# Patient Record
Sex: Male | Born: 1965 | Race: Asian | Hispanic: No | Marital: Married | Smoking: Never smoker
Health system: Southern US, Community
[De-identification: ages and names within clinical notes are randomized; demographics above are authoritative.]

## PROBLEM LIST (undated history)

## (undated) DIAGNOSIS — T7840XA Allergy, unspecified, initial encounter: Secondary | ICD-10-CM

## (undated) HISTORY — DX: Allergy, unspecified, initial encounter: T78.40XA

---

## 1999-08-06 ENCOUNTER — Encounter: Payer: Self-pay | Admitting: Emergency Medicine

## 1999-08-06 ENCOUNTER — Emergency Department (HOSPITAL_COMMUNITY): Admission: EM | Admit: 1999-08-06 | Discharge: 1999-08-06 | Payer: Self-pay | Admitting: *Deleted

## 1999-08-07 ENCOUNTER — Emergency Department (HOSPITAL_COMMUNITY): Admission: EM | Admit: 1999-08-07 | Discharge: 1999-08-07 | Payer: Self-pay | Admitting: Emergency Medicine

## 1999-08-16 ENCOUNTER — Inpatient Hospital Stay (HOSPITAL_COMMUNITY): Admission: EM | Admit: 1999-08-16 | Discharge: 1999-08-23 | Payer: Self-pay | Admitting: Emergency Medicine

## 1999-08-16 ENCOUNTER — Encounter: Payer: Self-pay | Admitting: Emergency Medicine

## 1999-08-16 ENCOUNTER — Encounter (INDEPENDENT_AMBULATORY_CARE_PROVIDER_SITE_OTHER): Payer: Self-pay | Admitting: Specialist

## 1999-08-20 ENCOUNTER — Encounter: Payer: Self-pay | Admitting: Nephrology

## 1999-08-28 ENCOUNTER — Encounter: Payer: Self-pay | Admitting: Nephrology

## 1999-08-28 ENCOUNTER — Encounter: Admission: RE | Admit: 1999-08-28 | Discharge: 1999-08-28 | Payer: Self-pay | Admitting: Nephrology

## 2000-04-10 ENCOUNTER — Encounter: Admission: RE | Admit: 2000-04-10 | Discharge: 2000-04-10 | Payer: Self-pay | Admitting: Internal Medicine

## 2000-05-11 ENCOUNTER — Encounter: Admission: RE | Admit: 2000-05-11 | Discharge: 2000-05-11 | Payer: Self-pay | Admitting: Internal Medicine

## 2000-06-12 ENCOUNTER — Encounter: Admission: RE | Admit: 2000-06-12 | Discharge: 2000-06-12 | Payer: Self-pay | Admitting: Internal Medicine

## 2001-02-24 ENCOUNTER — Encounter: Admission: RE | Admit: 2001-02-24 | Discharge: 2001-02-24 | Payer: Self-pay

## 2001-02-24 ENCOUNTER — Inpatient Hospital Stay (HOSPITAL_COMMUNITY): Admission: EM | Admit: 2001-02-24 | Discharge: 2001-02-26 | Payer: Self-pay | Admitting: Emergency Medicine

## 2001-02-25 ENCOUNTER — Encounter: Payer: Self-pay | Admitting: Internal Medicine

## 2001-03-04 ENCOUNTER — Emergency Department (HOSPITAL_COMMUNITY): Admission: EM | Admit: 2001-03-04 | Discharge: 2001-03-04 | Payer: Self-pay

## 2001-03-10 ENCOUNTER — Encounter: Admission: RE | Admit: 2001-03-10 | Discharge: 2001-03-10 | Payer: Self-pay

## 2002-03-29 ENCOUNTER — Encounter: Payer: Self-pay | Admitting: Emergency Medicine

## 2002-03-29 ENCOUNTER — Emergency Department (HOSPITAL_COMMUNITY): Admission: EM | Admit: 2002-03-29 | Discharge: 2002-03-29 | Payer: Self-pay | Admitting: Emergency Medicine

## 2004-02-29 ENCOUNTER — Inpatient Hospital Stay (HOSPITAL_COMMUNITY): Admission: EM | Admit: 2004-02-29 | Discharge: 2004-03-03 | Payer: Self-pay | Admitting: Emergency Medicine

## 2004-02-29 ENCOUNTER — Ambulatory Visit: Payer: Self-pay | Admitting: Internal Medicine

## 2004-04-08 ENCOUNTER — Ambulatory Visit (HOSPITAL_COMMUNITY): Admission: RE | Admit: 2004-04-08 | Discharge: 2004-04-08 | Payer: Self-pay | Admitting: Nephrology

## 2004-04-08 ENCOUNTER — Encounter: Admission: RE | Admit: 2004-04-08 | Discharge: 2004-04-08 | Payer: Self-pay | Admitting: Nephrology

## 2004-04-11 ENCOUNTER — Emergency Department (HOSPITAL_COMMUNITY): Admission: EM | Admit: 2004-04-11 | Discharge: 2004-04-11 | Payer: Self-pay | Admitting: Emergency Medicine

## 2004-04-23 ENCOUNTER — Emergency Department (HOSPITAL_COMMUNITY): Admission: EM | Admit: 2004-04-23 | Discharge: 2004-04-23 | Payer: Self-pay | Admitting: Emergency Medicine

## 2008-09-17 ENCOUNTER — Emergency Department (HOSPITAL_COMMUNITY): Admission: EM | Admit: 2008-09-17 | Discharge: 2008-09-18 | Payer: Self-pay | Admitting: Emergency Medicine

## 2010-08-06 LAB — DIFFERENTIAL
Basophils Absolute: 0 10*3/uL (ref 0.0–0.1)
Basophils Relative: 0 % (ref 0–1)
Eosinophils Absolute: 0.1 10*3/uL (ref 0.0–0.7)
Eosinophils Relative: 1 % (ref 0–5)
Lymphocytes Relative: 8 % — ABNORMAL LOW (ref 12–46)
Lymphs Abs: 1.1 10*3/uL (ref 0.7–4.0)
Monocytes Absolute: 1.1 10*3/uL — ABNORMAL HIGH (ref 0.1–1.0)
Monocytes Relative: 7 % (ref 3–12)
Neutro Abs: 12.5 10*3/uL — ABNORMAL HIGH (ref 1.7–7.7)
Neutrophils Relative %: 85 % — ABNORMAL HIGH (ref 43–77)

## 2010-08-06 LAB — LIPASE, BLOOD: Lipase: 18 U/L (ref 11–59)

## 2010-08-06 LAB — COMPREHENSIVE METABOLIC PANEL
ALT: 20 U/L (ref 0–53)
AST: 27 U/L (ref 0–37)
Albumin: 4 g/dL (ref 3.5–5.2)
Alkaline Phosphatase: 43 U/L (ref 39–117)
BUN: 13 mg/dL (ref 6–23)
CO2: 24 mEq/L (ref 19–32)
Calcium: 9.4 mg/dL (ref 8.4–10.5)
Chloride: 106 mEq/L (ref 96–112)
Creatinine, Ser: 0.69 mg/dL (ref 0.4–1.5)
GFR calc Af Amer: >60 mL/min (ref >60)
GFR calc non Af Amer: >60 mL/min (ref >60)
Glucose, Bld: 120 mg/dL — ABNORMAL HIGH (ref 70–99)
Potassium: 3.6 mEq/L (ref 3.5–5.1)
Sodium: 138 mEq/L (ref 135–145)
Total Bilirubin: 0.9 mg/dL (ref 0.3–1.2)
Total Protein: 7.6 g/dL (ref 6.0–8.3)

## 2010-08-06 LAB — CBC
HCT: 44.4 % (ref 39.0–52.0)
Hemoglobin: 15.2 g/dL (ref 13.0–17.0)
MCHC: 34.2 g/dL (ref 30.0–36.0)
MCV: 90 fL (ref 78.0–100.0)
Platelets: 180 K/uL (ref 150–400)
RBC: 4.94 MIL/uL (ref 4.22–5.81)
RDW: 13 % (ref 11.5–15.5)
WBC: 14.8 K/uL — ABNORMAL HIGH (ref 4.0–10.5)

## 2010-08-06 LAB — URINALYSIS, ROUTINE W REFLEX MICROSCOPIC
Bilirubin Urine: NEGATIVE
Glucose, UA: NEGATIVE mg/dL
Hgb urine dipstick: NEGATIVE
Ketones, ur: 15 mg/dL — AB
Nitrite: NEGATIVE
Protein, ur: NEGATIVE mg/dL
Specific Gravity, Urine: 1.02 (ref 1.005–1.030)
Urobilinogen, UA: 0.2 mg/dL (ref 0.0–1.0)
pH: 5 (ref 5.0–8.0)

## 2010-09-13 NOTE — Discharge Summary (Signed)
NAME:  Antonio Zuniga, Antonio Zuniga                   ACCOUNT NO.:  [id number]   MEDICAL RECORD NO.:  [id number]          PATIENT TYPE:  INP   LOCATION:  5019                         FACILITY:  [facility]   PHYSICIAN:  Melissa L. Ladona Ridgel, MD  DATE OF BIRTH:  1966-02-29   DATE OF ADMISSION:  02/28/2004  DATE OF DISCHARGE:  03/03/2004                                 DISCHARGE SUMMARY   DISCHARGE DIAGNOSES:  1.  Abdominal pain.  The etiology of the patient's abdominal pain is      unclear.  His cultures have remained negative for infectious process.      His ultrasound of the gallbladder appears to be noncontributory for      acute cholecystitis.  It is likely that the edema related to his minimal      change kidney disease may be contributing to some malabsorption and/or      the patient may have gastroenteritis.  The patient was seen during the      hospital stay by the [facility] who felt that no further work-up was      necessary as long as the patient was afebrile and did not have any      abdominal discomfort and was able to eat.  2.  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      Patient has a history of being on chronic steroids which has resulted in      bone changes.  He did not follow up appropriately with the outpatient      setting likely secondary to a miscommunication with his primary care      physician.  I have explained to him that he needs to be in close contact      with Dr. Marland Mcalpine office and maintain all of his appointments at the      office and bring a translator since it seems he is not able to      understand the [place] language well enough to take care of himself.  At      this time after consulting with the nephrologist the decision has been      made not to start him on steroids but that he should follow-up in the      outpatient office for potential more intensive therapy that would be     appropriate for his proteinuria and minimal change disease.  3.  Avascular necrosis of the bilateral hips.  At this time, relatively      asymptomatic.  I have spoken with Dr. Sherlean Foot who is the unassigned on-      call orthopedist at this time in the hospital.  Dr. Sherlean Foot stated that he      would be willing to see [name] in the outpatient setting.  I have      therefore provided a phone number for him to follow up with the      orthopedist.  Eventually, the patient will likely need bilateral hip  replacements.  At this time he does not appear to be symptomatic from      his disease and we will therefore refer him for outpatient follow-up.  4.  Hypothyroidism.  The patient appears to have clinical hypothyroidism      which may be contributing to his lower extremity edema.  I have      therefore started him on low dose Synthroid and explained to him the      significance of his disease.  We will attempt to obtain an outpatient      endocrinology follow-up.  5.  Hypercholesterolemia.  Patient's cholesterol is unacceptably high.  I      have therefore started him on Zocor.  I am going to request a follow-up      with his primary care practitioner who we have yet to establish.   DISCHARGE MEDICATIONS:  1.  Zocor 40 mg q.h.s.  2.  Synthroid 25 mcg p.o. daily.  3.  I have reviewed the patient's case at length using a translator during      the full course of the hospital stay and at discharge I have reviewed      his pink discharge sheet through the interpreter.  Patient voiced      understanding of what I have written in English on his sheet and has      made [place] notations so he will recall what this sheet says.   HISTORY OF PRESENT ILLNESS:  Patient is a 45 year old [place] male who  presented to the emergency room with diarrhea, abdominal cramping for  several days.  The patient also states he does not understand why his lower  extremity is swelling and his abdomen is  distended.  After investigating the  patient's past medical history we discovered that he has been previously  diagnosed with minimal change disease under the care of the [facility].  He has had biopsy and has been treated in the past with  prednisone.  The patient returns to the hospital with exacerbation of his  minimal change disease.  With regard to his abdominal pain, this appears  unrelated to his minimal change disease and a full GI work-up was obtained.  The patient was placed on empiric antibiotics to cover his gallbladder and  [facility] was consulted to evaluate him.  After reviewing his ultrasounds  and HIDA it appears he has no acute gallbladder disease at this time and he  had no evidence for bacterial infection on his cultures.  It is felt that  perhaps he has gastroenteritis that may be viral in origin.  His antibiotics  were therefore discontinued.  He remained afebrile with no further symptoms  of diarrhea or abdominal pain.  He was able to eat a full diet.   With regard to his renal disease he obviously needs intensive follow-up as  an outpatient.  I have discussed this disease with him through an  interpreter.  He expressed understanding of the extent and severity of his  disease and what to expect from his disease.  We have agreed that he will  follow up at the [facility] as soon as he is discharged.  He  states that he has an appointment on Monday, November 1 which is timely as  it is the day after discharge.  I will attempt to assist the patient with  obtaining a primary care physician and will speak with Dr. Hyman Hopes about also  assisting him with  follow-up for endocrinology and primary care.   PHYSICAL EXAMINATION:  VITAL SIGNS:  On the day of discharge the patient's  vital signs are stable.  He is afebrile.  GENERAL:  Remains in no acute distress. HEENT:  Pupils are equal, round, and reactive to light.  Extraocular muscles  are  intact.  Mucous membranes are moist.  NECK:  Supple.  There is no JVD.  No lymph nodes.  CHEST:  Clear to auscultation.  There is no rhonchi, rales, or wheezes.  CARDIOVASCULAR:  Regular rate and rhythm.  Positive S1, S2.  No S3, S4.  No  murmurs, rubs, or gallops.  ABDOMEN:  Soft, nontender, nondistended with no ascites.  EXTREMITIES:  No edema.  There is 2+ pulses.  SKIN:  Dry.  NEUROLOGIC:  The patient is nonfocal.   LABORATORIES:  Stool cultures which were no salmonella, shigella,  Campylobacter, or Yersinia.  His C. difficile was negative.  His Giardia and  a Cryptosporidium are negative.  His thyroid studies reveal a TSH of 7.9, T3  of 89.3, and a T4 of 3.6 which is low.  His total 24-hour creatinine  revealed a value of 1932 which is excessively elevated and his total 24-hour  protein is excessively elevated at 9765.  His blood cultures have remained  negative during the course of his hospital stay.  His total cholesterol is  560 with a triglyceride of 132 and an LDL of 480.  His HDL is 54.  Hepatic  functions are within normal limits.  There are no elevations in the AST or  ALT.   At this time we feel that the patient is stable for discharge to home with  follow-up to nephrology and the establishment of primary care and  endocrinology follow-up.  I have also given him the phone number for Dr.  Sherlean Foot to follow up for his avascular necrosis of his hips.      Meli   MLT/MEDQ  D:  03/05/2004  T:  03/05/2004  Job:  [id number]   cc:   Garnetta Buddy, M.D.  [address]  [place]  [place] [address]  Fax: [phone]   Mila Homer. Sherlean Foot, M.D.  [address] [place]  [place] [address]  Fax: [phone]

## 2010-09-13 NOTE — Discharge Summary (Signed)
[place]. [facility]  Patient:    Antonio Zuniga, Antonio Zuniga Visit Number: [id number] MRN: [id number]          Service Type: MED Location: [phone] Attending Physician:  Madaline Guthrie Dictated by:   Jennette Kettle, M.D. Admit Date:  02/24/2001 Discharge Date: 02/26/2001                             Discharge Summary  DISCHARGE DIAGNOSES: 1. Recurrent nephrotic syndrome (current hospitalization). 2. Abdominal pain (likely secondary to #1, current hospitalization resolved). 3. Positive PPD, status post nine months of isoniazid treatment.  DISCHARGE MEDICATIONS: 1. Prednisone 70 mg p.o. q.d. 2. Lasix 40 mg p.o. q.d. 3. Protonix 40 mg p.o. q.d.  FOLLOWUP: 1. The patient has a follow-up appointment with Dr. Manson Passey in the internal    medicine outpatient clinic on March 10, 2001, at 3:15 p.m. 2. The patient will follow up with [facility], Dr. [name],    telephone number [phone].  [facility] was notified, and    they will call the patient at home with the appointment time.  PROCEDURES AND DIAGNOSTIC STUDIES:  The patient had abdominal and chest x-ray which revealed a left small pleural effusion with a negative abdominal x-ray.  CONSULTATIONS:  [employer].  HISTORY OF PRESENT ILLNESS:  The patient is a 45 year old [place] male with a past medical history of nephrotic syndrome (admitted in April 2001) who presented on 02/24/01, with the chief complaint of abdominal pain x 1 day. Positive vomiting.  Abdominal pain was diffuse, seeming mostly in the epigastric, umbilical region.  The patient denied any diarrhea, constipation, hematemesis, melena, hematochezia, dysuria, or hematuria.  The patient, at the time of admission, stated this feeling was similar to his last episode when he was admitted for his nephrotic syndrome.  The patient also complained of bilateral lower extremity swelling.  ADMISSION LABORATORY  DATA:  The patient initially had electrolytes that revealed sodium of 139, potassium 3.9, chloride 104, bicarbonate 32, BUN 26, creatinine 1.0, calcium 7.7, glucose of 136.  The patients total protein was 5.1, albumin was decreased at 1.4.  The patient initially had an UA which revealed greater than 300 protein with many squamous epithelial/granualar casts, 3 to 6 white blood cells, 7 to 10 red blood cells, few bacteria.  The patients initial liver function tests revealed an AST of 23, ALT 14, alkaline phosphatase 47, total bilirubin 1.5.  The patients initial CBC revealed a white count of 9.7, hemoglobin 15.7, platelets 266 with no left shift.  HOSPITAL COURSE:  #1 - ABDOMINAL PAIN:  The likely etiology for the patients abdominal pain was likely related to the nephrotic syndrome, given his previous similar episode. The patient did have a normal lipase level at 21 upon admission.  The patient had a negative abdominal x-ray.  The patients abdominal pain subsided after a 24 hour period.  The patient was started on prednisone, Lasix, and Protonix for his likely nephrotic syndrome.  The patient had a sedimentation rate which was slightly elevated at 79.  The patient has a pending CENCA and PENCA at the time of discharge.  Other possible etiologies, which are not as likely, included a vasculitis versus gastritis.  #2 - NEPHROTIC SYNDROME:  The patient was admitted to the hospital, renal was consulted.  The patient was initially started on prednisone 70 mg IV q.d., as well as Lasix 80 mg IV q.12h.  A  24 hour urine was collected.  The patient had an UA with results above.  [facility] recommendations were to follow up with Dr. [name] as an outpatient, and continue the steroids until notified by [facility].  The patient is being discharged home on p.o. Lasix 40 mg q.d.  The patients lower extremity swelling has subsided. The patient did have a decreased albumin, as  well as total protein.  FOLLOWUP:  The patient has a follow-up appointment with Dr. Manson Passey, who is his primary care physician at the internal medicine outpatient clinic on March 10, 2001, at 3:15.  At that time, consider checking a basic metabolic panel for potassium levels, given that the patient is being discharged home on Lasix 40 mg p.o. q.d.  The patient at discharge had a potassium level of 4.2.  The patient does have a pending CENCA and PENCA, as well as a 24 hour urine protein at the time of discharge.  Further followup with [facility] will be telephoned to the patient upon discharge. Dictated by:   Jennette Kettle, M.D. Attending Physician:  Madaline Guthrie DD:  02/26/01 TD:  03/01/01 Job: [id number]

## 2010-09-13 NOTE — Discharge Summary (Signed)
[place]. [facility]  Patient:    Antonio Zuniga, Antonio Zuniga                        MRN: [id number] Adm. Date:  [id number] Disc. Date: [id number] Attending:  Maree Krabbe Dictator:   Leory Plowman, M.D.                           Discharge Summary  ADDENDUM #2 [phone])  HOSPITAL COURSE:  In light of the patients positive PPD and possible need for immunosuppressive therapy, the case was discussed with Dr. Fransisco Hertz. Dr. Maurice March recommended one year of isoniazid for this patient as the CDC recommendations are now for all patients with positive PPDs to receive isoniazid therapy and since this patient may very well need immunosuppressive treatment for his nephrotic syndrome. DD:  08/23/99 TD:  08/24/99 Job: [id number]

## 2010-09-13 NOTE — Discharge Summary (Signed)
[place]. [facility]  Patient:    ANOOP, HEMMER                        MRN: [id number] Adm. Date:  [id number] Disc. Date: [id number] Attending:  Maree Krabbe Dictator:   Leory Plowman, M.D.                           Discharge Summary  ADDENDUM [phone])  DISCHARGE DIAGNOSES:  Add:  Positive PPD with chest x-ray negative for evidence of tuberculosis.  No prior PPD placement reported.  DISCHARGE MEDICATIONS:  Delete:  Prednisone.  Patient is only discharged on Lasix.  HOSPITAL COURSE:  Add:  Note that patients PPD was read was read as positive by the nursing staff who is skilled in making this assessment.  For this reason, he is sent home on Lasix alone and not prednisone as the benefits are uncertain and the risks are known. DD:  08/23/99 TD:  08/24/99 Job: [id number]

## 2010-09-13 NOTE — H&P (Signed)
NAME:  Antonio Zuniga, Antonio Zuniga                   ACCOUNT NO.:  [id number]   MEDICAL RECORD NO.:  [id number]          PATIENT TYPE:  INP   LOCATION:                               FACILITY:  [facility]   PHYSICIAN:  [name], M.D.DATE OF BIRTH:  [birth year]   DATE OF ADMISSION:  02/29/2004  DATE OF DISCHARGE:                                HISTORY & PHYSICAL   PRIMARY CARE PHYSICIAN:  Unassigned   CHIEF COMPLAINT:  Abdominal pain with nausea and vomiting.   HISTORY OF PRESENT ILLNESS:  The patient is a 45 year old [place] male,  very limited English, who presents with above complaints x1 day.  The  patient had his friend at bed side initially interpreting but he is now gone  home and history is obtained from patient and chart review.  She describes  the abdominal pain as upper abdominal in location, moderate intensity,  aching, and no alleviating or exacerbating factors noted.  He had vomiting  x1 day and from ER records he stated that he also had diarrhea x1 day.  He  admitted to subjective fevers and chills, dysuria, back pain, headaches, and  sore throat.  The patient also admitted to lower extremity edema x4 days.  She denied shortness of breath and no PND.   The patient was evaluated in the emergency room and a CT scan of the abdomen  and pelvis obtained and was found to be abnormal and also his urinalysis  revealed proteinuria and the patient is admitted to the [facility] hospitalists  service for further evaluation and management.   PAST MEDICAL HISTORY:  None.   MEDICATIONS:  None.   SOCIAL HISTORY:  Denies tobacco.  Occasional alcohol.  Denies illicit drug  use.   FAMILY HISTORY:  Noncontributory.  Denies family history of diabetes or  hypertension.   REVIEW OF SYSTEMS:  As per HPI.  Other comprehensive review of systems  negative.   PHYSICAL EXAMINATION:  GENERAL:  The patient is a middle-aged [place]  male in no acute distress.  VITAL SIGNS:  Temperature 98, blood  pressure 129/74, pulse 64, respiratory  rate 18, O2 saturation 96%.  HEENT:  PERRL, EOMI.  Sclerae anicteric.  Moist mucous membranes.  No oral  exudates.  NECK:  Supple.  No adenopathy.  No thyromegaly.  LUNGS:  Clear to auscultation bilaterally.  No crackles or wheezes.  CARDIOVASCULAR:  Normal S1, S2.  Regular rate and rhythm.  ABDOMEN:  Soft, normoactive bowel sounds.  Epigastric and supraumbilical  tenderness present.  No rebound tenderness.  No masses palpable.  Nondistended.  EXTREMITIES:  +1 edema present up to knee.  NEUROLOGIC:  Alert and oriented x3.  Cranial nerves II-XII grossly intact.  Nonfocal examination.   LABORATORY DATA:  The CT scan of the abdomen and pelvis:  Small bilateral  pleural effusions.  Gallbladder wall looks thickened.  Cannot exclude  cholecystitis.  Small amount of pelvic ascites and mild mesenteric edema.  Nonvisualization of the appendix.  There is avascular necrosis of both  femoral heads and spurring in the left SI joint.  The  urinalysis is clear in  appearance with specific gravity greater than 1.040, pH 7.0, leukocyte  esterase negative, nitrite negative, protein greater than 300, urine wbc's 0-  2, urine rbc's 0-2, bacteria rare hyalin casts.  Sodium 134, potassium 4.4,  chloride 105, BUN 18, glucose 98.  pH 7.37, pCO2 52.4, bicarbonate 30.3.  Hematocrit 50, hemoglobin 17, white cell count 12.3, platelet count 254,  neutrophil count 86%.  His lipase is 24.   ASSESSMENT/PLAN:  1.  Abdominal pain/?cholecystitis, acalculous.  Check LFTs and HIDA scan as      well as hepatitis panel and blood cultures.  Patient's lipase noted to      be within normal limits.  Will start empiric antibiotics with Unasyn to      cover for possible cholecystitis, supportive care.  Will follow and      consider a surgical consultation pending above tests.  2.  Proteinuria.  A urinalysis was protein greater than 300.  Also, specific      gravity noted to be greater than  1.040.  The patient was scheduled for      an outpatient appointment with Dr. Hyman Hopes.  BUN and creatinine noted to be      within normal limits.  Will check a serum albumin and 24-hour urine for      creatinine and protein.  Will follow and consider inpatient      nephrology/Dr. Hyman Hopes consult pending above tests.  3.  Bilateral avascular necrosis of femoral heads.  Patient denies hip/lower      extremity pain, falling.  Consider orthopedic consult in a.m.  4.  Peripheral edema.  Will check albumin as above and evaluation to rule      out a nephrotic syndrome/kidney disease per studies above.  Follow and      consider 2-D echocardiogram for further evaluation.  5.  Small pleural effusion/small pelvic ascites/mild mesenteric edema.  Per      CT scan.  Will check albumin as above.  Follow and consider further      evaluation as appropriate.        ___________________________________________  [name], M.D.    ACV/MEDQ  D:  02/29/2004  T:  02/29/2004  Job:  [id number]   cc:   Dala Dock

## 2010-09-13 NOTE — Consult Note (Signed)
NAMESABASTIEN, Antonio Zuniga                   ACCOUNT NO.:  [id number]   MEDICAL RECORD NO.:  [id number]          PATIENT TYPE:  INP   LOCATION:  5019                         FACILITY:  [facility]   PHYSICIAN:  Antonio Zuniga, M.D.DATE OF BIRTH:  12-17-1965   DATE OF CONSULTATION:  03/01/2004  DATE OF DISCHARGE:                                   CONSULTATION   CHIEF COMPLAINT:  Abdominal pain with nausea and vomiting.   HISTORY OF PRESENT ILLNESS:  This is a 45 year old [place] male with very  limited English who presents to [facility] with nausea, vomiting,  abdominal distention, and generalized swelling/edema for one week's  duration.  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  It is difficult to assess how long the patient was on  prednisone at that time.  Records indicate that he was to be on steroids for  six to nine months.  I have having considerable difficulty assessing his  compliance secondary to language difficulty.  Otherwise patient denies any  chest pain, shortness of breath, dysuria, or hematuria.  We are asked to see  patient for massive proteinuria.   PAST MEDICAL HISTORY:  1.  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  2.  Positive PPD.  Records indicate the patient was instructed to complete a      2-month course of INH.  Again, it is unclear if he was complaint.   MEDICATIONS:  None on hospital admission.  He is currently on:  1.  Unasyn 3 g IV q.6h.  2.  Protonix 40 mg daily.  3.  Zocor 40 mg daily.  4.  Phenergan 25 mg q.6h. p.r.n.   ALLERGIES:  No known drug allergies.   SOCIAL HISTORY:  Denies any history of alcohol, tobacco, or drug abuse.   FAMILY HISTORY:  Noncontributory.  He denies any history of diabetes or  hypertension.   REVIEW OF SYSTEMS:  As per HPI.  Other comprehensive Review of Systems was  essentially negative.   PHYSICAL EXAMINATION:  GENERAL: This is a well-built, middle-aged  [place]  male.  He is in no apparent acute distress.  He is lying comfortably in bed.  VITAL SIGNS:  Temperature 97.2 degrees F, pulse 69 and regular, respirations  13, blood pressure 106/72 mmHg.  Saturation 97% on room air.  HEENT:  Pupils equal, round, and reactive to light.  Extraocular muscles are  intact.  Sclerae is anicteric.  Conjunctivae moist.  NECK:  Supple.  There is no JVD, thyromegaly, tracheal deviation, or carotid  bruits.  CARDIOVASCULAR:  S1, S2 are regular in rate and rhythm.  There are no  murmurs, rubs, or gallops.  There is no S3 or S4.  LUNGS:  Bibasilar crackles and decreased bibasilar breath sounds.  Otherwise  clear to auscultation.  ABDOMEN:  Soft, nontender, nondistended.  Positive bowel sounds.  No  organomegaly.  EXTREMITIES:  1 to 2+ pitting edema to the level of the knee.  Dorsalis  pedis bilaterally is faint but palpable.  Bilateral radial pulses are  appropriate at  2/2.  NEUROLOGIC:  He is alert, awake.  He is oriented x 3.  Cranial nerves II-XII  grossly intact.  There are no focal deficits.   LABORATORY AND X-RAY DATA:  A 24-hour urine protein 9765 mg.  Urine  creatinine is 1932 g per 24 hours.  Calculated creatinine clearance is 95 ml  per minute.  CBC shows WBC 7.6, hemoglobin 14.5, platelets 237, C. difficile  toxin negative.  Stool for white blood cells in negative.  Sodium 133,  potassium 3.5, chloride 102, bicarb 28, BUN 13, creatinine 1.0, glucose 111,  total protein 0.6, alkaline phosphatase 86, AST 24, ALT 12, total protein  3.9, albumin less than 1.0, calcium 7.0.  Corrected calcium is 9.4.  Lipase  24.  Cholesterol panel shows cholesterol 560, triglycerides 132, HDL 54, LDL  40.  Blood cultures:  No growth.  Fecal occult blood is negative.  Urinalysis is clear with a specific gravity greater than 1.04, pH 7.0, and  greater than 300 mg/dl protein.   CT findings show free pelvic fluid as well as bilateral femoral head  avascular  necrosis and small bilateral pleural effusions.   Biopsy in April 2001 shows minimal-change versus FSG.   IMPRESSION AND PLAN:  1.  Massive proteinuria consistent with minimal-change disease.  The patient      needs high-dose steroids, but in light of his CT findings of avascular      necrosis, I am somewhat hesitant to start him on prednisone.  Ideally,      the patient needs 1 mg/kg for 8 to 16 weeks or 1 week after remission      has been induced.  Remission would be the goal of treatment which is to      reduce proteinuria or essentially eliminate proteinuria.  Following      remission, the patient would be on 1 mg/kg on alternate days to reduce      the adverse effects of glucocorticoids.  Once the proteinuria      disappears, the patient can be tapered off prednisone.  Again, the      patient was scheduled to follow up with [facility] following his      biopsy, but he failed to do so.  He was lost to followup for the last      two years and had been taking prednisone on his own accord.  For this      reason, I am somewhat hesitant to start him on prednisone or cytotoxic      agents.  Other agents which are less toxic, which would also decrease      his proteinuria, would include ACE inhibitors, ARBs, or verapamil or      trial of CellCept.  Again, these would be more appropriate for this      patient considering his compliance.  The problem with ACE inhibitors and      ARBs would be limited with his blood pressure.  He does run systolic      blood pressure in the low 100s.  2.  Hypercholesterolemia. The patient has an LDL of 480.  He is at great      risk for coronary artery disease/stroke with an LDL of this level.  He      needs aggressive management.  This can be done with Lipitor or Vitorin.      Also his hypercholesterolemia will be improved once his proteinuria is      at rest.  The patient  has an appointment with [facility], Dr. [name],      on December 17 at 2:30 in  the afternoon.       IO/MEDQ  D:  03/01/2004  T:  03/02/2004  Job:  [id number]   cc:   [name], M.D.

## 2010-09-13 NOTE — Discharge Summary (Signed)
[place]  Patient:    Antonio Zuniga, Antonio Zuniga                        MRN: [id number] Adm. Date:  [id number] Disc. Date: [id number] Attending:  Maree Krabbe Dictator:   Leory Plowman, M.D.                           Discharge Summary  DISCHARGE DIAGNOSES: 1. Nephrotic syndrome with 6.7 g 24-hour urinary protein, diagnosis pending. 2. Hyperlipidemia, LDL 530, triglycerides 156. 3. Hypoalbuminemia with albumin 0.8.  DISCHARGE MEDICATIONS: 1. Prednisone 70 mg p.o. q.d. 2. Lasix 40 mg p.o. q.d.  FOLLOW-UP:  Patient is scheduled to follow up with Dr. Camille Bal on May 1 at 4:15.  At that time, a BMET may be appropriate as the patient has been on Lasix.  The results of his renal biopsy which was sent to Dr. Ricke Hey at the [facility] may be available at that time.  HISTORY OF PRESENT ILLNESS:  Antonio Zuniga is a 45 year old [place] gentleman with no past medical history who presented to [facility] emergency room on August 06, 1999, complaining of a poor appetite and lower extremity edema.  He denied any other specific complaints except for a vague sense of weakness and some nausea.  ALLERGIES:  None.  PAST MEDICAL HISTORY:  None.  PAST SURGICAL HISTORY:  None.  PRIOR MEDICATIONS:  None.  SOCIAL HISTORY:  Patient is a [place] gentleman who has been living in the [place] for three years.  He is married with a 30-year-old little girl. He is a nonsmoker, nondrinker, and a nondrug user.  He has no unusual exposure to animals, TB exposure, recent travel, or history of homosexuality.  REVIEW OF SYSTEMS:  Negative except as described above.  PHYSICAL EXAMINATION:  Significant for a patient who had decreased breath sounds at the bases and 4+ pitting edema of the feet extending up to the thighs.  LABORATORY DATA:  Significant for a total protein of 3.5 and albumin of 0.8, a BUN of 14, with a creatinine of 0.8, and a  potassium of 4.4.  His bicarb was 30.  His hemoglobin was 15.4.  Chest x-ray revealed small bilateral pleural effusions.  IMPRESSION:  A 45 year old previously healthy gentleman with new onset nephrotic syndrome.  HOSPITAL COURSE:  Patient was admitted for treatment of edema and serologic evaluation of his nephrotic syndrome.  He was treated with Lasix initially IV and then p.o. and diuresed quite readily.  Serologic evaluation included an ANA, hepatitis C, hepatitis B, complement studies, HIV test, ASO titer, all of which were normal.  Of note is that a 24-hour urine protein revealed 6.69 g of protein, and his lipid panel showed a cholesterol of 613 with triglycerides 156, HDL 52, and LDL 530.  A renal biopsy was performed by Dr. Marina Gravel on August 20, 1999.  Four passes were made at the kidney with only one core sample obtained.  The patient tolerated the procedure well with stable hemoglobin and no evidence of bleeding.  The results of the biopsy were pending at the time of his discharge.  Throughout the patients hospital stay, the family was kept apprised of his course via daily meetings with English-speaking friends.  On the date of discharge, it was decided to discharge the patient on oral steroids and low-dose Lasix therapy with close follow-up.  Note that  a PPD was performed which was negative and a chest x-ray from admission did not show evidence of tuberculosis.  DISCHARGE LABORATORY DATA:  Sodium 135, potassium 3.9, chloride 101, bicarb 31, BUN 14, creatinine 0.7, and glucose 102.  Calcium was 7.5.  Hemoglobin 13.9, white blood cell count 8.1, platelets 269.  DISCHARGE DISPOSITION:  Patient is discharged to home.  CONDITION ON DISCHARGE:  Stable. DD:  08/23/99 TD:  08/23/99 Job: [id number]

## 2010-09-13 NOTE — Consult Note (Signed)
[place]  Patient:    Antonio Zuniga, Antonio Zuniga Visit Number: [id number] MRN: [id number]          Service Type: MED Location: 1800 [id number] Attending Physician:  Sandi Raveling Dictated by:   Cecille Aver, M.D. Admit Date:  02/24/2001                            Consultation Report  CONSULTING PHYSICIAN:  Elliott L. Effie Shy, M.D.  REASON FOR CONSULTATION:  Nephrotic syndrome.  HISTORY OF PRESENT ILLNESS:  Antonio Zuniga is a 45 year old [place] male who initially came to medical attention in April of 2001 when he presented with nephrotic syndrome. He had a full serologic workup that was within normal limits. 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 He was treated with steroids for this. According to his primary care physician, the patient responded fairly quickly and went into remission of his nephrotic syndrome and was eventually weaned off of the steroids. The official records regarding this treatment are not available to me at this time to verify this. He had been in his usual state of health until he presented to the outpatient clinic today with a three-day history of worsening lower extremity edema, nausea, vomiting, malaise, plus or minus fevers and chills. He reported that these symptoms "the same symptoms as last time." He is admitted for further evaluation mostly secondary to the language barrier and his possible inability to understand the situation.  PAST MEDICAL HISTORY:  History of nephrotic syndrome as above.  ALLERGIES:  No known drug allergies.  CURRENT MEDICATIONS:  P.r.n. Lasix.  SOCIAL HISTORY:  He has been living in the [place] for four years. He is non-English speaking. He is married and has one child. There is no significant history of tobacco, alcohol, or any other drug use.  FAMILY HISTORY:   Noncontributory.  REVIEW OF SYSTEMS:  Difficult to obtain secondary to a language barrier. It is positive for GI upset, nausea, vomiting, and abdominal pain, and also lower extremity edema. He denies any other change in his urine output. He does have a decreased appetite.  PHYSICAL EXAMINATION:  GENERAL:  He is a somnolent but arousable, young male. He is in no acute distress. It is difficult to communicate with him secondary to the language barrier.  VITAL SIGNS:  Blood pressure is 140/92, heart rate of 75, respirations of 20, temperature is 98.7, oxygen saturation is 96% on room air.  HEENT:  Pupils are equal, round, and reactive to light. Extraocular motions are intact. Mucous membranes are moist and without lesions.  NECK:  There is no jugular venous distention or lymphadenopathy.  LUNGS:  Clear to auscultation bilaterally without wheezes.  CARDIOVASCULAR:  Regular rate and rhythm without murmur, gallop, or rub.  ABDOMEN:  Positive bowel sounds. It is diffusely, mildly, tender; nondistended. There is no hepatosplenomegaly.  EXTREMITIES:  There is 1 to 2+ edema to lower extremities bilaterally to the level of his thighs.  NEUROLOGICAL:  The patient is somnolent but arousable and alert. Follows commands.  LABORATORY DATA:  White blood count of 9.7, hemoglobin of 15.7, platelet count of 266,000. Sodium of 139, potassium 3.9, chloride of 104, bicarb of 32, BUN and creatinine of 26 and 1, with glucose of 136; his albumin is 1.4. Urinalysis is performed and is remarkable for 4+ protein and moderate blood.  ASSESSMENT:  A 45 year old [place] male with recurrent nephrotic syndrome.  1. Renal:  Recurrent nephrotic syndrome. His course is most consistent with    minimal change disease. Since patient reportedly has a quick and good    response to steroids last time, I would begin him back on prednisone at 1    mg/kg per day. This will need to be followed as an outpatient. 2.  Nausea, vomiting, and gastrointestinal distress:  This is not exactly    consistent with symptoms of nephrotic syndrome. I have advised for them to    give him PPI with the steroid dose to eliminate any GI distress. Otherwise,    symptomatic treatment for now will be given. I agree with acute abdominal    series to rule out any other intra-abdominal pathology.  Thank you for the consultation. We will follow with you. Dictated by:   Cecille Aver, M.D. Attending Physician:  Sandi Raveling DD:  02/24/01 TD:  02/25/01 Job: [id number]

## 2011-09-12 ENCOUNTER — Ambulatory Visit (INDEPENDENT_AMBULATORY_CARE_PROVIDER_SITE_OTHER): Payer: BC Managed Care – PPO | Admitting: Family Medicine

## 2011-09-12 DIAGNOSIS — J301 Allergic rhinitis due to pollen: Secondary | ICD-10-CM

## 2011-09-12 DIAGNOSIS — H1010 Acute atopic conjunctivitis, unspecified eye: Secondary | ICD-10-CM

## 2011-09-12 MED ORDER — OLOPATADINE HCL 0.1 % OP SOLN: 1.0000 [drp] | Freq: Two times a day (BID) | OPHTHALMIC | Status: AC

## 2011-09-12 MED ORDER — FLUTICASONE PROPIONATE 50 MCG/ACT NA SUSP: 2.0000 | Freq: Every day | NASAL | Status: DC

## 2011-09-12 NOTE — Progress Notes (Signed)
  Patient Name: Antonio Zuniga Date of Birth: 1965/12/20 Medical Record Number: [id number] Gender: male Date of Encounter: 09/12/2011  History of Present Illness:  [name] is a 46 y.o. very pleasant male patient who presents with the following:  Here today with complaint of stuffy/ runny nose and sneezing for about 2 months.  Eyes are also itchy and irritated. A little bit of cough.   No fever, nausea or vomiting.   Otherwise he is generally healthy and does tend to have these symptoms every year.   He has tried some mucinex but it did not help much  There is no problem list on file for this patient.  No past medical history on file. No past surgical history on file. History  Substance Use Topics  . Smoking status: Never Smoker   . Smokeless tobacco: Not on file  . Alcohol Use: Not on file   No family history on file. No Known Allergies  Medication list has been reviewed and updated.  Review of Systems: As per HPI- otherwise negative.   Physical Examination: Filed Vitals:   09/12/11 1649  BP: 128/87  Pulse: 61  Temp: 97.7 F (36.5 C)  Resp: 18  Height: 5\' 9"  (1.753 m)  Weight: 175 lb (79.379 kg)    Body mass index is 25.84 kg/(m^2).  GEN: WDWN, NAD, Non-toxic, A & O x 3 HEENT: Atraumatic, Normocephalic. Neck supple. No masses, No LAD.  Nasal cavity congested, eyes irritated, PEERL, oropharynx wnl.  TM wnl bilaterally Ears and Nose: No external deformity. CV: RRR, No M/G/R. No JVD. No thrill. No extra heart sounds. PULM: CTA B, no wheezes, crackles, rhonchi. No retractions. No resp. distress. No accessory muscle use. EXTR: No c/c/e NEURO Normal gait.  PSYCH: Normally interactive. Conversant. Not depressed or anxious appearing.  Calm demeanor.    Assessment and Plan: 1. Allergic rhinitis  fluticasone (FLONASE) 50 MCG/ACT nasal spray  2. Allergic conjunctivitis  olopatadine (PATANOL) 0.1 % ophthalmic solution   Yearly AR/ AC.  Treat as above- also may try  OTC claritin or zyrtec.  Patient (or parent if minor) instructed to return to clinic or call if not better in 4-5 day(s).

## 2012-09-27 ENCOUNTER — Other Ambulatory Visit: Payer: Self-pay | Admitting: Family Medicine

## 2013-06-04 ENCOUNTER — Ambulatory Visit: Payer: BC Managed Care – PPO | Admitting: Family Medicine

## 2013-06-04 VITALS — BP 128/72 | HR 71 | Temp 97.7°F | Resp 16

## 2013-06-04 DIAGNOSIS — J302 Other seasonal allergic rhinitis: Secondary | ICD-10-CM

## 2013-06-04 DIAGNOSIS — J309 Allergic rhinitis, unspecified: Secondary | ICD-10-CM

## 2013-06-04 MED ORDER — FLUTICASONE PROPIONATE 50 MCG/ACT NA SUSP: 2.0000 | Freq: Every day | NASAL | Status: DC

## 2013-06-04 MED ORDER — OLOPATADINE HCL 0.1 % OP SOLN: 1.0000 [drp] | Freq: Two times a day (BID) | OPHTHALMIC | Status: DC

## 2013-06-04 NOTE — Progress Notes (Signed)
      Chief Complaint:  Chief Complaint  Patient presents with  . Eye Pain    Irritated and itching in both eyes x1 month  . Nasal Congestion    x3 weeks    HPI: Antonio Zuniga is a 48 y.o. male who is here for  Allergies and allergic rhinitis. He has been on patanol and flonase in the past and that works well for him. HE is from [place] and he states that he does not have sxs when he goes to other places and ocuntries, he recenlty came back from the [place] and everything was fine there until he came back TO [place]. HE has had allergies starting about 3 years ago, He has runny nose and itchiness in both eyes. No fevers or chills. No SOB, wheezes. Worse in the AM. HE was told to take an antihistaimne regular but does not, uses medicines prn  Past Medical History  Diagnosis Date  . Allergy    History reviewed. No pertinent past surgical history. History   Social History  . Marital Status: Single    Spouse Name: N/A    Number of Children: N/A  . Years of Education: N/A   Social History Main Topics  . Smoking status: Never Smoker   . Smokeless tobacco: None  . Alcohol Use: None  . Drug Use: None  . Sexual Activity: None   Other Topics Concern  . None   Social History Narrative  . None   No family history on file. No Known Allergies Prior to Admission medications   Medication Sig Start Date End Date Taking? Authorizing Provider  fluticasone (FLONASE) 50 MCG/ACT nasal spray Place 2 sprays into the nose daily. 09/12/11 09/11/12  Gay Filler Copland, MD     ROS: The patient denies fevers, chills, night sweats, unintentional weight loss, chest pain, palpitations, wheezing, dyspnea on exertion, nausea, vomiting, abdominal pain, dysuria, hematuria, melena, numbness, weakness, or tingling.   All other systems have been reviewed and were otherwise negative with the exception of those mentioned in the HPI and as above.    PHYSICAL EXAM: Filed Vitals:   06/04/13 1428  BP: 128/72   Pulse: 71  Temp: 97.7 F (36.5 C)  Resp: 16   There were no vitals filed for this visit. There is no weight on file to calculate BMI.  General: Alert, no acute distress HEENT:  Normocephalic, atraumatic, oropharynx patent. EOMI, PERRLA, + boggy nasal mucosa, TM nl Cardiovascular:  Regular rate and rhythm, no rubs murmurs or gallops.  No Carotid bruits, radial pulse intact. No pedal edema.  Respiratory: Clear to auscultation bilaterally.  No wheezes, rales, or rhonchi.  No cyanosis, no use of accessory musculature GI: No organomegaly, abdomen is soft and non-tender, positive bowel sounds.  No masses. Skin: No rashes. Neurologic: Facial musculature symmetric. Psychiatric: Patient is appropriate throughout our interaction. Lymphatic: No cervical lymphadenopathy Musculoskeletal: Gait intact.   LABS:    EKG/XRAY:   Primary read interpreted by Dr. Marin Comment at [facility].   ASSESSMENT/PLAN: Encounter Diagnoses  Name Primary?  . Allergic rhinitis Yes  . Seasonal allergies    Refill Flonase and Patanol x 1 year Advise to take zyrtec daily F/u prn  Gross sideeffects, risk and benefits, and alternatives of medications d/w patient. Patient is aware that all medications have potential sideeffects and we are unable to predict every sideeffect or drug-drug interaction that may occur.  , [place], DO 06/04/2013 3:04 PM

## 2014-07-24 ENCOUNTER — Ambulatory Visit (INDEPENDENT_AMBULATORY_CARE_PROVIDER_SITE_OTHER): Payer: BLUE CROSS/BLUE SHIELD | Admitting: Physician Assistant

## 2014-07-24 VITALS — BP 102/88 | HR 63 | Temp 97.4°F | Resp 16 | Ht 69.0 in | Wt 128.8 lb

## 2014-07-24 DIAGNOSIS — J3089 Other allergic rhinitis: Secondary | ICD-10-CM | POA: Diagnosis not present

## 2014-07-24 MED ORDER — CETIRIZINE HCL 10 MG PO TABS: 10.0000 mg | ORAL_TABLET | Freq: Every day | ORAL | Status: DC

## 2014-07-24 MED ORDER — IPRATROPIUM BROMIDE 0.03 % NA SOLN: 2.0000 | Freq: Two times a day (BID) | NASAL | Status: DC

## 2014-07-24 MED ORDER — OLOPATADINE HCL 0.1 % OP SOLN: 1.0000 [drp] | Freq: Two times a day (BID) | OPHTHALMIC | Status: DC

## 2014-07-24 NOTE — Patient Instructions (Addendum)
Allergies  Allergies may happen from anything your body is sensitive to. This may be food, medicines, pollens, chemicals, and many other things.  HOME CARE  If you do not know what causes a reaction, keep a diary. Write down the foods you ate and the symptoms that followed.   If you have red raised spots (hives) or a rash:  Take medicine as told by your doctor.  Use medicines for red raised spots and itching as needed.  If you are severely allergic:  It is often necessary to go to the hospital after you have treated your reaction.  Wear your medical alert jewelry.  You and your family must learn how to give a allergy shot or use an allergy kit (anaphylaxis kit).  Always carry your allergy kit or shot with you. Use this medicine as told by your doctor if a severe reaction is occurring. GET HELP RIGHT AWAY IF:  You have trouble breathing or are making high-pitched whistling sounds (wheezing).  You have a tight feeling in your chest or throat.  You have a puffy (swollen) mouth.  You have red raised spots, puffiness (swelling), or itching all over your body.  You have had a severe reaction that was helped by your allergy kit or shot. The reaction can return once the medicine has worn off.  You think you are having a food allergy. Symptoms most often happen within 30 minutes of eating a food.  Your symptoms have not gone away within 2 days or are getting worse.  You have new symptoms.  You want to retest yourself with a food or drink you think causes an allergic reaction. Only do this under the care of a doctor. MAKE SURE YOU:   Understand these instructions.  Will watch your condition.  Will get help right away if you are not doing well or get worse. Document Released: 08/09/2012 Document Reviewed: 08/09/2012 [facility] Patient Information 2015 [place]. This information is not intended to replace advice given to you by your health care provider. Make sure you discuss  any questions you have with your health care provider.

## 2014-07-24 NOTE — Progress Notes (Signed)
   Subjective:    Patient ID: Antonio Zuniga, male    DOB: 09-02-1965, 49 y.o.   MRN: [id number]  HPI Patient presents for allergies that he gets every year around this time. Sx included itchy, dry eyes, rhinorrhea, congestion, HA and some sore throat. Denies fever, sinus/ear pressure, fatigue, or cough. Has not tried any medication this season. Has had flonase in the past and did not feel that it worked. No h/o asthma. No known sick contacts. NKDA.   Review of Systems  Constitutional: Negative for fever, chills and fatigue.  HENT: Positive for congestion, rhinorrhea, sneezing and sore throat. Negative for ear discharge, ear pain, postnasal drip, sinus pressure and trouble swallowing.   Respiratory: Negative for cough, chest tightness, shortness of breath and wheezing.   Cardiovascular: Negative for chest pain.  Gastrointestinal: Negative for nausea and vomiting.  Neurological: Positive for headaches. Negative for dizziness.       Objective:   Physical Exam  Constitutional: He is oriented to person, place, and time. He appears well-developed and well-nourished. No distress.  Blood pressure 102/88, pulse 63, temperature 97.4 F (36.3 C), temperature source Oral, resp. rate 16, height 5\' 9"  (1.753 m), weight 128 lb 12.8 oz (58.423 kg), SpO2 92 %.  HENT:  Head: Normocephalic and atraumatic.  Right Ear: Tympanic membrane, external ear and ear canal normal.  Left Ear: Tympanic membrane, external ear and ear canal normal.  Nose: Rhinorrhea (with moderate erythema) present. No mucosal edema or sinus tenderness. Right sinus exhibits no maxillary sinus tenderness and no frontal sinus tenderness. Left sinus exhibits no maxillary sinus tenderness and no frontal sinus tenderness.  Mouth/Throat: Uvula is midline, oropharynx is clear and moist and mucous membranes are normal. No oropharyngeal exudate, posterior oropharyngeal edema, posterior oropharyngeal erythema or tonsillar abscesses.  Tonsils 1+  hypertrophic bilaterally  Eyes: Conjunctivae are normal. Pupils are equal, round, and reactive to light. Right eye exhibits no discharge. Left eye exhibits no discharge.  Neck: Normal range of motion. Neck supple. No thyromegaly present.  Cardiovascular: Normal rate, regular rhythm and normal heart sounds.  Exam reveals no gallop and no friction rub.   No murmur heard. Pulmonary/Chest: Effort normal and breath sounds normal. No respiratory distress. He has no wheezes. He has no rales.  Lymphadenopathy:    He has no cervical adenopathy.  Neurological: He is alert and oriented to person, place, and time.  Skin: Skin is warm and dry. No rash noted. He is not diaphoretic. No erythema. No pallor.       Assessment & Plan:  1. Environmental and seasonal allergies - ipratropium (ATROVENT) 0.03 % nasal spray; Place 2 sprays into both nostrils 2 (two) times daily.  Dispense: 30 mL; Refill: 0 - cetirizine (ZYRTEC) 10 MG tablet; Take 1 tablet (10 mg total) by mouth daily.  Dispense: 30 tablet; Refill: 11 - olopatadine (PATANOL) 0.1 % ophthalmic solution; Place 1 drop into both eyes 2 (two) times daily.  Dispense: 5 mL; Refill: 12   Chrystian Cupples PA-C  [facility] 07/24/2014 3:14 PM

## 2015-02-22 ENCOUNTER — Ambulatory Visit (INDEPENDENT_AMBULATORY_CARE_PROVIDER_SITE_OTHER): Payer: BLUE CROSS/BLUE SHIELD | Admitting: Family Medicine

## 2015-02-22 VITALS — BP 130/80 | HR 91 | Temp 99.3°F | Resp 16 | Ht 68.0 in | Wt 172.0 lb

## 2015-02-22 DIAGNOSIS — R509 Fever, unspecified: Secondary | ICD-10-CM | POA: Diagnosis not present

## 2015-02-22 DIAGNOSIS — R040 Epistaxis: Secondary | ICD-10-CM

## 2015-02-22 DIAGNOSIS — H65191 Other acute nonsuppurative otitis media, right ear: Secondary | ICD-10-CM

## 2015-02-22 DIAGNOSIS — J029 Acute pharyngitis, unspecified: Secondary | ICD-10-CM | POA: Diagnosis not present

## 2015-02-22 LAB — POCT RAPID STREP A (OFFICE): Rapid Strep A Screen: NEGATIVE

## 2015-02-22 MED ORDER — AZITHROMYCIN 250 MG PO TABS: ORAL_TABLET | ORAL | Status: DC

## 2015-02-22 NOTE — Patient Instructions (Addendum)
Start antibiotic (azithromycin) for throat and possible early ear infection. Tylenol or advil if needed for pain and fever. Saline nasal spray atleast 4 times per day to lessen nosebleeds.  If nosebleed continues - return for recheck here or in emergency room. Over the counter mucinex or mucinex DM if needed for cough. Drink plenty of fluids.  Return to clinic if worsening.  Return to the clinic or go to the nearest emergency room if any of your symptoms worsen or new symptoms occur. You should receive a call or letter about your lab results within the next week to 10 days.    Sore Throat A sore throat is pain, burning, irritation, or scratchiness of the throat. There is often pain or tenderness when swallowing or talking. A sore throat may be accompanied by other symptoms, such as coughing, sneezing, fever, and swollen neck glands. A sore throat is often the first sign of another sickness, such as a cold, flu, strep throat, or mononucleosis (commonly known as mono). Most sore throats go away without medical treatment. CAUSES  The most common causes of a sore throat include:  A viral infection, such as a cold, flu, or mono.  A bacterial infection, such as strep throat, tonsillitis, or whooping cough.  Seasonal allergies.  Dryness in the air.  Irritants, such as smoke or pollution.  Gastroesophageal reflux disease (GERD). HOME CARE INSTRUCTIONS   Only take over-the-counter medicines as directed by your caregiver.  Drink enough fluids to keep your urine clear or pale yellow.  Rest as needed.  Try using throat sprays, lozenges, or sucking on hard candy to ease any pain (if older than 4 years or as directed).  Sip warm liquids, such as broth, herbal tea, or warm water with honey to relieve pain temporarily. You may also eat or drink cold or frozen liquids such as frozen ice pops.  Gargle with salt water (mix 1 tsp salt with 8 oz of water).  Do not smoke and avoid secondhand  smoke.  Put a cool-mist humidifier in your bedroom at night to moisten the air. You can also turn on a hot shower and sit in the bathroom with the door closed for 5-10 minutes. SEEK IMMEDIATE MEDICAL CARE IF:  You have difficulty breathing.  You are unable to swallow fluids, soft foods, or your saliva.  You have increased swelling in the throat.  Your sore throat does not get better in 7 days.  You have nausea and vomiting.  You have a fever or persistent symptoms for more than 2-3 days.  You have a fever and your symptoms suddenly get worse. MAKE SURE YOU:   Understand these instructions.  Will watch your condition.  Will get help right away if you are not doing well or get worse.   This information is not intended to replace advice given to you by your health care provider. Make sure you discuss any questions you have with your health care provider.   Document Released: 05/22/2004 Document Revised: 05/05/2014 Document Reviewed: 12/21/2011 Elsevier Interactive Patient Education 2016 [employer].   Otitis Media, Adult Otitis media is redness, soreness, and inflammation of the middle ear. Otitis media may be caused by allergies or, most commonly, by infection. Often it occurs as a complication of the common cold. SIGNS AND SYMPTOMS Symptoms of otitis media may include:  Earache.  Fever.  Ringing in your ear.  Headache.  Leakage of fluid from the ear. DIAGNOSIS To diagnose otitis media, your health care provider  will examine your ear with an otoscope. This is an instrument that allows your health care provider to see into your ear in order to examine your eardrum. Your health care provider also will ask you questions about your symptoms. TREATMENT  Typically, otitis media resolves on its own within 3-5 days. Your health care provider may prescribe medicine to ease your symptoms of pain. If otitis media does not resolve within 5 days or is recurrent, your health care  provider may prescribe antibiotic medicines if he or she suspects that a bacterial infection is the cause. HOME CARE INSTRUCTIONS   If you were prescribed an antibiotic medicine, finish it all even if you start to feel better.  Take medicines only as directed by your health care provider.  Keep all follow-up visits as directed by your health care provider. SEEK MEDICAL CARE IF:  You have otitis media only in one ear, or bleeding from your nose, or both.  You notice a lump on your neck.  You are not getting better in 3-5 days.  You feel worse instead of better. SEEK IMMEDIATE MEDICAL CARE IF:   You have pain that is not controlled with medicine.  You have swelling, redness, or pain around your ear or stiffness in your neck.  You notice that part of your face is paralyzed.  You notice that the bone behind your ear (mastoid) is tender when you touch it. MAKE SURE YOU:   Understand these instructions.  Will watch your condition.  Will get help right away if you are not doing well or get worse.   This information is not intended to replace advice given to you by your health care provider. Make sure you discuss any questions you have with your health care provider.   Document Released: 01/18/2004 Document Revised: 05/05/2014 Document Reviewed: 11/09/2012 Elsevier Interactive Patient Education 2016 [place] are common. They are due to a crack in the inside lining of your nose (mucous membrane) or from a small blood vessel that starts to bleed. Nosebleeds can be caused by many conditions, such as injury, infections, dry mucous membranes or dry climate, medicines, nose picking, and home heating and cooling systems. Most nosebleeds come from blood vessels in the front of your nose. HOME CARE INSTRUCTIONS   Try controlling your nosebleed by pinching your nostrils gently and continuously for at least 10 minutes.  Avoid blowing or sniffing your nose for a  number of hours after having a nosebleed.  Do not put gauze inside your nose yourself. If your nose was packed by your health care provider, try to maintain the pack inside of your nose until your health care provider removes it.  If a gauze pack was used and it starts to fall out, gently replace it or cut off the end of it.  If a balloon catheter was used to pack your nose, do not cut or remove it unless your health care provider has instructed you to do that.  Avoid lying down while you are having a nosebleed. Sit up and lean forward.  Use a nasal spray decongestant to help with a nosebleed as directed by your health care provider.  Do not use petroleum jelly or mineral oil in your nose. These can drip into your lungs.  Maintain humidity in your home by using less air conditioning or by using a humidifier.  Aspirinand blood thinners make bleeding more likely. If you are prescribed these medicines and you suffer from nosebleeds,  ask your health care provider if you should stop taking the medicines or adjust the dose. Do not stop medicines unless directed by your health care provider  Resume your normal activities as you are able, but avoid straining, lifting, or bending at the waist for several days.  If your nosebleed was caused by dry mucous membranes, use over-the-counter saline nasal spray or gel. This will keep the mucous membranes moist and allow them to heal. If you must use a lubricant, choose the water-soluble variety. Use it only sparingly, and do not use it within several hours of lying down.  Keep all follow-up visits as directed by your health care provider. This is important. SEEK MEDICAL CARE IF:  You have a fever.  You get frequent nosebleeds.  You are getting nosebleeds more often. SEEK IMMEDIATE MEDICAL CARE IF:  Your nosebleed lasts longer than 20 minutes.  Your nosebleed occurs after an injury to your face, and your nose looks crooked or broken.  You have  unusual bleeding from other parts of your body.  You have unusual bruising on other parts of your body.  You feel light-headed or you faint.  You become sweaty.  You vomit blood.  Your nosebleed occurs after a head injury.   This information is not intended to replace advice given to you by your health care provider. Make sure you discuss any questions you have with your health care provider.   Document Released: 01/22/2005 Document Revised: 05/05/2014 Document Reviewed: 11/28/2013 Elsevier Interactive Patient Education [employer].

## 2015-02-22 NOTE — Progress Notes (Addendum)
Subjective:  This chart was scribed for Merri Ray, MD by Leandra Kern, Medical Scribe. This patient was seen in Room 9 and the patient's care was started at 3:49 PM.   Patient ID: Antonio Zuniga, male    DOB: August 22, 1965, 49 y.o.   MRN: [id number]  Chief Complaint  Patient presents with  . Cough  . Sore Throat    Onset last night    HPI HPI Comments: Antonio Zuniga is a 49 y.o. male who presents to [facility] complaining of flu-like symptoms, onset yesterday.  Pt reports associated symptoms of epistaxis, rhinorrhea, nasal congestion, minimal cough with some soreness of throat, myalgia, and chills. Pt denies sick contact, or otalgia.     There are no active problems to display for this patient.  Past Medical History  Diagnosis Date  . Allergy    No past surgical history on file. No Known Allergies Prior to Admission medications   Medication Sig Start Date End Date Taking? Authorizing Provider  acetaminophen (TYLENOL) 500 MG tablet Take 500 mg by mouth every 6 (six) hours as needed.   Yes Historical Provider, MD  ipratropium (ATROVENT) 0.03 % nasal spray Place 2 sprays into both nostrils 2 (two) times daily. 07/24/14  Yes Tishira R Brewington, PA-C  cetirizine (ZYRTEC) 10 MG tablet Take 1 tablet (10 mg total) by mouth daily. Patient not taking: Reported on 02/22/2015 07/24/14   Tishira R Brewington, PA-C  olopatadine (PATANOL) 0.1 % ophthalmic solution Place 1 drop into both eyes 2 (two) times daily. Patient not taking: Reported on 02/22/2015 07/24/14   Nolene Bernheim, PA-C   Social History   Social History  . Marital Status: Single    Spouse Name: N/A  . Number of Children: N/A  . Years of Education: N/A   Occupational History  . Not on file.   Social History Main Topics  . Smoking status: Never Smoker   . Smokeless tobacco: Never Used  . Alcohol Use: No  . Drug Use: No  . Sexual Activity: Not on file   Other Topics Concern  . Not on  file   Social History Narrative    Review of Systems  Constitutional: Positive for chills.  HENT: Positive for congestion, rhinorrhea and sore throat. Negative for ear pain.   Respiratory: Positive for cough.   Musculoskeletal: Positive for myalgias.       Objective:   Physical Exam  Constitutional: He is oriented to person, place, and time. He appears well-developed and well-nourished. No distress.  HENT:  Head: Normocephalic and atraumatic.  Right Ear: Tympanic membrane, external ear and ear canal normal.  Left Ear: Tympanic membrane, external ear and ear canal normal.  Nose: No rhinorrhea.  Mouth/Throat: Oropharynx is clear and moist and mucous membranes are normal. No oropharyngeal exudate or posterior oropharyngeal erythema.  Left TM: Minimal erythema with clear  Right TM: Small bulla on the posterior aspect.   Dried blood on the right nasal septum, no active bleeding.    Erythema with slight colored drainage on the posterior oropharynx.   Sinuses are non tender.    Eyes: Conjunctivae and EOM are normal. Pupils are equal, round, and reactive to light.  Neck: Neck supple.  No lymphadenopathy.   Cardiovascular: Normal rate, regular rhythm, normal heart sounds and intact distal pulses.   No murmur heard. Pulmonary/Chest: Effort normal and breath sounds normal. He has no wheezes. He has no rhonchi. He has no rales.  Abdominal:  Soft. There is no tenderness.  Lymphadenopathy:    He has no cervical adenopathy.  Neurological: He is alert and oriented to person, place, and time. No cranial nerve deficit.  Skin: Skin is warm and dry. No rash noted.  Psychiatric: He has a normal mood and affect. His behavior is normal.  Nursing note and vitals reviewed.  Filed Vitals:   02/22/15 1457  BP: 130/80  Pulse: 91  Temp: 99.3 F (37.4 C)  TempSrc: Oral  Resp: 16  Height: 5\' 8"  (1.727 m)  Weight: 172 lb (78.019 kg)  SpO2: 98%   Results for orders placed or performed in visit  on 02/22/15  POCT rapid strep A  Result Value Ref Range   Rapid Strep A Screen Negative Negative       Assessment & Plan:   Antonio Zuniga is a 49 y.o. male Sore throat - Plan: POCT rapid strep A, Culture, Group A Strep, azithromycin (ZITHROMAX) 250 MG tablet  Fever chills - Plan: POCT rapid strep A, azithromycin (ZITHROMAX) 250 MG tablet  Acute nonsuppurative otitis media of right ear - Plan: azithromycin (ZITHROMAX) 250 MG tablet  Epistaxis  Possible viral illness, reassuring rapid strep.  However right ear appears to have early bulla possible early bullous myringitis otitis media. With recent fever, will treat with Z-Pak to cover for early bullous myringitis or possible false-negative rapid strep. Symptomatic care and RTC precautions discussed.  No active epistaxis. Discussed saline nasal spray, and handout given if epistaxis recurs. RTC precautions  Meds ordered this encounter  Medications  . acetaminophen (TYLENOL) 500 MG tablet    Sig: Take 500 mg by mouth every 6 (six) hours as needed.   Patient Instructions  Start antibiotic (azithromycin) for throat and possible early ear infection. Tylenol or advil if needed for pain and fever. Saline nasal spray atleast 4 times per day to lessen nosebleeds.  If nosebleed continues - return for recheck here or in emergency room. Over the counter mucinex or mucinex DM if needed for cough. Drink plenty of fluids.  Return to clinic if worsening.  Return to the clinic or go to the nearest emergency room if any of your symptoms worsen or new symptoms occur. You should receive a call or letter about your lab results within the next week to 10 days.    Sore Throat A sore throat is pain, burning, irritation, or scratchiness of the throat. There is often pain or tenderness when swallowing or talking. A sore throat may be accompanied by other symptoms, such as coughing, sneezing, fever, and swollen neck glands. A sore throat is often the first sign  of another sickness, such as a cold, flu, strep throat, or mononucleosis (commonly known as mono). Most sore throats go away without medical treatment. CAUSES  The most common causes of a sore throat include:  A viral infection, such as a cold, flu, or mono.  A bacterial infection, such as strep throat, tonsillitis, or whooping cough.  Seasonal allergies.  Dryness in the air.  Irritants, such as smoke or pollution.  Gastroesophageal reflux disease (GERD). HOME CARE INSTRUCTIONS   Only take over-the-counter medicines as directed by your caregiver.  Drink enough fluids to keep your urine clear or pale yellow.  Rest as needed.  Try using throat sprays, lozenges, or sucking on hard candy to ease any pain (if older than 4 years or as directed).  Sip warm liquids, such as broth, herbal tea, or warm water with honey to relieve pain  temporarily. You may also eat or drink cold or frozen liquids such as frozen ice pops.  Gargle with salt water (mix 1 tsp salt with 8 oz of water).  Do not smoke and avoid secondhand smoke.  Put a cool-mist humidifier in your bedroom at night to moisten the air. You can also turn on a hot shower and sit in the bathroom with the door closed for 5-10 minutes. SEEK IMMEDIATE MEDICAL CARE IF:  You have difficulty breathing.  You are unable to swallow fluids, soft foods, or your saliva.  You have increased swelling in the throat.  Your sore throat does not get better in 7 days.  You have nausea and vomiting.  You have a fever or persistent symptoms for more than 2-3 days.  You have a fever and your symptoms suddenly get worse. MAKE SURE YOU:   Understand these instructions.  Will watch your condition.  Will get help right away if you are not doing well or get worse.   This information is not intended to replace advice given to you by your health care provider. Make sure you discuss any questions you have with your health care provider.     Document Released: 05/22/2004 Document Revised: 05/05/2014 Document Reviewed: 12/21/2011 Elsevier Interactive Patient Education 2016 [employer].   Otitis Media, Adult Otitis media is redness, soreness, and inflammation of the middle ear. Otitis media may be caused by allergies or, most commonly, by infection. Often it occurs as a complication of the common cold. SIGNS AND SYMPTOMS Symptoms of otitis media may include:  Earache.  Fever.  Ringing in your ear.  Headache.  Leakage of fluid from the ear. DIAGNOSIS To diagnose otitis media, your health care provider will examine your ear with an otoscope. This is an instrument that allows your health care provider to see into your ear in order to examine your eardrum. Your health care provider also will ask you questions about your symptoms. TREATMENT  Typically, otitis media resolves on its own within 3-5 days. Your health care provider may prescribe medicine to ease your symptoms of pain. If otitis media does not resolve within 5 days or is recurrent, your health care provider may prescribe antibiotic medicines if he or she suspects that a bacterial infection is the cause. HOME CARE INSTRUCTIONS   If you were prescribed an antibiotic medicine, finish it all even if you start to feel better.  Take medicines only as directed by your health care provider.  Keep all follow-up visits as directed by your health care provider. SEEK MEDICAL CARE IF:  You have otitis media only in one ear, or bleeding from your nose, or both.  You notice a lump on your neck.  You are not getting better in 3-5 days.  You feel worse instead of better. SEEK IMMEDIATE MEDICAL CARE IF:   You have pain that is not controlled with medicine.  You have swelling, redness, or pain around your ear or stiffness in your neck.  You notice that part of your face is paralyzed.  You notice that the bone behind your ear (mastoid) is tender when you touch it. MAKE  SURE YOU:   Understand these instructions.  Will watch your condition.  Will get help right away if you are not doing well or get worse.   This information is not intended to replace advice given to you by your health care provider. Make sure you discuss any questions you have with your health care provider.  Document Released: 01/18/2004 Document Revised: 05/05/2014 Document Reviewed: 11/09/2012 Elsevier Interactive Patient Education 2016 [place] are common. They are due to a crack in the inside lining of your nose (mucous membrane) or from a small blood vessel that starts to bleed. Nosebleeds can be caused by many conditions, such as injury, infections, dry mucous membranes or dry climate, medicines, nose picking, and home heating and cooling systems. Most nosebleeds come from blood vessels in the front of your nose. HOME CARE INSTRUCTIONS   Try controlling your nosebleed by pinching your nostrils gently and continuously for at least 10 minutes.  Avoid blowing or sniffing your nose for a number of hours after having a nosebleed.  Do not put gauze inside your nose yourself. If your nose was packed by your health care provider, try to maintain the pack inside of your nose until your health care provider removes it.  If a gauze pack was used and it starts to fall out, gently replace it or cut off the end of it.  If a balloon catheter was used to pack your nose, do not cut or remove it unless your health care provider has instructed you to do that.  Avoid lying down while you are having a nosebleed. Sit up and lean forward.  Use a nasal spray decongestant to help with a nosebleed as directed by your health care provider.  Do not use petroleum jelly or mineral oil in your nose. These can drip into your lungs.  Maintain humidity in your home by using less air conditioning or by using a humidifier.  Aspirinand blood thinners make bleeding more likely. If  you are prescribed these medicines and you suffer from nosebleeds, ask your health care provider if you should stop taking the medicines or adjust the dose. Do not stop medicines unless directed by your health care provider  Resume your normal activities as you are able, but avoid straining, lifting, or bending at the waist for several days.  If your nosebleed was caused by dry mucous membranes, use over-the-counter saline nasal spray or gel. This will keep the mucous membranes moist and allow them to heal. If you must use a lubricant, choose the water-soluble variety. Use it only sparingly, and do not use it within several hours of lying down.  Keep all follow-up visits as directed by your health care provider. This is important. SEEK MEDICAL CARE IF:  You have a fever.  You get frequent nosebleeds.  You are getting nosebleeds more often. SEEK IMMEDIATE MEDICAL CARE IF:  Your nosebleed lasts longer than 20 minutes.  Your nosebleed occurs after an injury to your face, and your nose looks crooked or broken.  You have unusual bleeding from other parts of your body.  You have unusual bruising on other parts of your body.  You feel light-headed or you faint.  You become sweaty.  You vomit blood.  Your nosebleed occurs after a head injury.   This information is not intended to replace advice given to you by your health care provider. Make sure you discuss any questions you have with your health care provider.   Document Released: 01/22/2005 Document Revised: 05/05/2014 Document Reviewed: 11/28/2013 Elsevier Interactive Patient Education [employer].         By signing my name below, I, Rawaa Al Rifaie, attest that this documentation has been prepared under the direction and in the presence of Merri Ray, MD.  Royann Shivers Rifaie, Medical Scribe. 02/22/2015.  3:56 PM.   I personally performed the services described in this documentation, which was scribed in my  presence. The recorded information has been reviewed and considered, and addended by me as needed.

## 2015-02-24 LAB — CULTURE, GROUP A STREP: Organism ID, Bacteria: NORMAL

## 2015-06-29 ENCOUNTER — Encounter (HOSPITAL_COMMUNITY): Payer: Self-pay | Admitting: *Deleted

## 2015-06-29 ENCOUNTER — Emergency Department (HOSPITAL_COMMUNITY)
Admission: EM | Admit: 2015-06-29 | Discharge: 2015-06-29 | Disposition: A | Discharge status: Home / Self Care | Payer: No Typology Code available for payment source | Attending: Emergency Medicine | Admitting: Emergency Medicine

## 2015-06-29 ENCOUNTER — Emergency Department (HOSPITAL_COMMUNITY): Payer: No Typology Code available for payment source

## 2015-06-29 DIAGNOSIS — M25551 Pain in right hip: Secondary | ICD-10-CM

## 2015-06-29 DIAGNOSIS — Z792 Long term (current) use of antibiotics: Secondary | ICD-10-CM | POA: Insufficient documentation

## 2015-06-29 DIAGNOSIS — M546 Pain in thoracic spine: Secondary | ICD-10-CM

## 2015-06-29 DIAGNOSIS — Y999 Unspecified external cause status: Secondary | ICD-10-CM | POA: Diagnosis not present

## 2015-06-29 DIAGNOSIS — S3991XA Unspecified injury of abdomen, initial encounter: Secondary | ICD-10-CM | POA: Diagnosis not present

## 2015-06-29 DIAGNOSIS — S79911A Unspecified injury of right hip, initial encounter: Secondary | ICD-10-CM | POA: Insufficient documentation

## 2015-06-29 DIAGNOSIS — M879 Osteonecrosis, unspecified: Secondary | ICD-10-CM | POA: Insufficient documentation

## 2015-06-29 DIAGNOSIS — S299XXA Unspecified injury of thorax, initial encounter: Secondary | ICD-10-CM | POA: Insufficient documentation

## 2015-06-29 DIAGNOSIS — Z79899 Other long term (current) drug therapy: Secondary | ICD-10-CM | POA: Diagnosis not present

## 2015-06-29 DIAGNOSIS — M87051 Idiopathic aseptic necrosis of right femur: Secondary | ICD-10-CM

## 2015-06-29 DIAGNOSIS — Y92410 Unspecified street and highway as the place of occurrence of the external cause: Secondary | ICD-10-CM | POA: Diagnosis not present

## 2015-06-29 DIAGNOSIS — Y9389 Activity, other specified: Secondary | ICD-10-CM | POA: Insufficient documentation

## 2015-06-29 DIAGNOSIS — M87052 Idiopathic aseptic necrosis of left femur: Secondary | ICD-10-CM

## 2015-06-29 MED ORDER — HYDROCODONE-ACETAMINOPHEN 5-325 MG PO TABS
1.0000 | ORAL_TABLET | Freq: Once | ORAL | Status: AC
  Administered 2015-06-29: 1 via ORAL
  Filled 2015-06-29: qty 1

## 2015-06-29 MED ORDER — IBUPROFEN 800 MG PO TABS: 800.0000 mg | ORAL_TABLET | Freq: Three times a day (TID) | ORAL | Status: DC | PRN

## 2015-06-29 MED ORDER — METHOCARBAMOL 500 MG PO TABS: 500.0000 mg | ORAL_TABLET | Freq: Four times a day (QID) | ORAL | Status: DC | PRN
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
## 2015-06-29 NOTE — ED Notes (Signed)
Pt ambulating independently w/ steady gait on d/c in no acute distress, A&Ox4. D/c instructions reviewed w/ pt - pt denies any further questions or concerns at present. Rx given x2  

## 2015-06-29 NOTE — Discharge Instructions (Signed)
Read the information below.  Use the prescribed medication as directed.  Please discuss all new medications with your pharmacist.  You may return to the Emergency Department at any time for worsening condition or any new symptoms that concern you.    If you develop fevers, loss of control of bowel or bladder, weakness or numbness in your legs, or are unable to walk, return to the ER for a recheck.  °

## 2015-07-03 ENCOUNTER — Ambulatory Visit (INDEPENDENT_AMBULATORY_CARE_PROVIDER_SITE_OTHER): Payer: BLUE CROSS/BLUE SHIELD | Admitting: Allergy and Immunology

## 2015-07-03 ENCOUNTER — Encounter: Payer: Self-pay | Admitting: Allergy and Immunology

## 2015-07-03 VITALS — BP 134/96 | HR 76 | Temp 98.1°F | Resp 20 | Ht 68.11 in | Wt 183.6 lb

## 2015-07-03 DIAGNOSIS — H1010 Acute atopic conjunctivitis, unspecified eye: Secondary | ICD-10-CM

## 2015-07-03 DIAGNOSIS — J309 Allergic rhinitis, unspecified: Secondary | ICD-10-CM | POA: Diagnosis not present

## 2015-07-03 MED ORDER — MONTELUKAST SODIUM 10 MG PO TABS: 10.0000 mg | ORAL_TABLET | Freq: Every day | ORAL | Status: DC

## 2015-07-03 MED ORDER — BEPOTASTINE BESILATE 1.5 % OP SOLN: OPHTHALMIC | Status: AC

## 2015-07-03 MED ORDER — METHYLPREDNISOLONE ACETATE 80 MG/ML IJ SUSP
80.0000 mg | Freq: Once | INTRAMUSCULAR | Status: AC
  Administered 2015-07-03: 80 mg via INTRAMUSCULAR

## 2015-07-03 NOTE — Progress Notes (Signed)
NEW PATIENT NOTE  Referring Provider: No ref. provider found Primary Provider: No PCP Per Patient Date of office visit: 07/03/2015    Subjective:   Chief Complaint:  Antonio Zuniga (DOB: 1965-12-17) is a 50 y.o. male with a chief complaint of Allergic Rhinitis   who presents to the clinic on 07/03/2015 with the following problems:  HPI Comments: Antonio Zuniga presents this clinic in evaluation of eye and nose issues. He's had a two-year history of recurrent nasal congestion and sneezing and runny nose and runny eyes and itchy eyes especially during the spring but apparently active throughout the year. There is no obvious trigger giving rise to this issue. He does not have any associated anosmia or ugly nasal discharge or headache or coughing. He has tried Therapist, art which has not helped him. He has tried antihistamines which has not helped him. He has no other atopic diseases or symptoms.   Past Medical History  Diagnosis Date  . Allergy     History reviewed. No pertinent past surgical history.  Outpatient Prescriptions Prior to Visit  Medication Sig Dispense Refill  . ibuprofen (ADVIL,MOTRIN) 800 MG tablet Take 1 tablet (800 mg total) by mouth every 8 (eight) hours as needed for mild pain or moderate pain. 15 tablet 0  . methocarbamol (ROBAXIN) 500 MG tablet Take 1-2 tablets (500-1,000 mg total) by mouth every 6 (six) hours as needed. 15 tablet 0  . acetaminophen (TYLENOL) 500 MG tablet Take 500 mg by mouth every 6 (six) hours as needed.    Marland Kitchen azithromycin (ZITHROMAX) 250 MG tablet Take 2 pills by mouth on day 1, then 1 pill by mouth per day on days 2 through 5. 6 tablet 0  . ipratropium (ATROVENT) 0.03 % nasal spray Place 2 sprays into both nostrils 2 (two) times daily. 30 mL 0   No facility-administered medications prior to visit.    No Known Allergies  Review of systems negative except as noted in HPI / PMHx or noted below:  Review of Systems  Constitutional: Negative.     HENT: Negative.   Eyes: Negative.   Respiratory: Negative.   Cardiovascular: Negative.   Gastrointestinal: Negative.   Genitourinary: Negative.   Musculoskeletal: Negative.   Skin: Negative.   Neurological: Negative.   Endo/Heme/Allergies: Negative.   Psychiatric/Behavioral: Negative.     History reviewed. No pertinent family history.  Social History   Social History  . Marital Status: Single    Spouse Name: N/A  . Number of Children: N/A  . Years of Education: N/A   Occupational History  . Not on file.   Social History Main Topics  . Smoking status: Never Smoker   . Smokeless tobacco: Never Used  . Alcohol Use: No  . Drug Use: No  . Sexual Activity: Not on file   Other Topics Concern  . Not on file   Social History Narrative    Environmental and Social history  Lives in a house with a dry environment, no animals located inside the household, no carpeting in the bedroom, no plastic on the bed or pillow, and no smokers inside the household. 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  Allergic shiners  HENT:  Head: Normocephalic.  Right Ear: External ear and ear canal normal. Right ear middle ear effusion: Dull light reflex   Left Ear: External ear and ear canal normal. Left ear middle ear effusion: dull light reflex.  Nose: Mucosal edema present. No rhinorrhea.  Mouth/Throat: Uvula is midline, oropharynx is clear and moist and mucous membranes are normal. No oropharyngeal exudate.  Eyes: Conjunctivae are normal. Right conjunctiva is not injected. Left conjunctiva is not injected.  Neck: Trachea normal.  No tracheal tenderness present. No tracheal deviation present. No thyromegaly present.  Cardiovascular: Normal rate, regular rhythm, S1 normal, S2 normal and normal heart sounds.   No murmur heard. Pulmonary/Chest: Breath sounds normal. No stridor. No respiratory distress. He has no wheezes. He has no rales.  Musculoskeletal: He exhibits no edema.  Lymphadenopathy:       Head (right side): No tonsillar adenopathy present.       Head (left side): No tonsillar adenopathy present.    He has no cervical adenopathy.    He has no axillary adenopathy.  Neurological: He is alert. Gait normal.  Skin: No rash noted. He is not diaphoretic. No erythema. Nails show no clubbing.  Psychiatric: Mood and affect normal.     Diagnostics: Allergy skin tests were performed. He demonstrated hypersensitivity against grasses, weeds, trees, and house dust mite.  Assessment and Plan:    1. Allergic rhinoconjunctivitis     1. Allergen avoidance measures  2. Never ever rub or touch eyes  3. Treat inflammation:   A. montelukast 10 mg one tablet one time per day  B. OTC Rhinocort one spray each nostril one time per day. Coupon.  D. Depo-Medrol 80 mg IM delivered in clinic today  4. If needed:   A. Bepreve 1 drop each eye twice a day. Coupon.  B. cetirizine 10 mg one tablet one time per day  5. Consider a course of immunotherapy  6. Return to clinic in 4 weeks or earlier if problem  Antonio Zuniga should do well with attention allergen avoidance measures in the consistent use of anti-inflammatory medications as specified above. If he does not have a good response to this therapy he would definitely be a candidate for immunotherapy. We'll see how things go over the course the next 4 weeks or so.  Allena Katz, MD [place]

## 2015-07-03 NOTE — Patient Instructions (Addendum)
  1. Allergen avoidance measures  2. Never ever rub or touch eyes  3. Treat inflammation:   A. montelukast 10 mg one tablet one time per day  B. OTC Rhinocort one spray each nostril one time per day. Coupon.  C. Depo-Medrol 80 mg IM delivered in clinic today  4. If needed:   A. Bepreve 1 drop each eye twice a day. Coupon.  B. cetirizine 10 mg one tablet one time per day  5. Consider a course of immunotherapy  6. Return to clinic in 4 weeks or earlier if problem

## 2016-06-20 ENCOUNTER — Other Ambulatory Visit: Payer: Self-pay | Admitting: Allergy and Immunology

## 2016-07-30 ENCOUNTER — Other Ambulatory Visit: Payer: Self-pay | Admitting: Allergy and Immunology

## 2017-02-23 ENCOUNTER — Other Ambulatory Visit: Payer: Self-pay

## 2017-03-16 ENCOUNTER — Other Ambulatory Visit: Payer: Self-pay | Admitting: *Deleted

## 2017-03-16 NOTE — Telephone Encounter (Signed)
Received refill request for Bepreve eye drops denied request. Patient has not been seen since 07/03/2015. Faxed back to [facility] [phone]

## 2017-08-10 ENCOUNTER — Other Ambulatory Visit: Payer: Self-pay | Admitting: Nephrology

## 2017-08-10 DIAGNOSIS — R319 Hematuria, unspecified: Secondary | ICD-10-CM

## 2017-08-17 ENCOUNTER — Ambulatory Visit
Admission: RE | Admit: 2017-08-17 | Discharge: 2017-08-17 | Disposition: A | Discharge status: Home / Self Care | Payer: Self-pay | Source: Ambulatory Visit | Attending: Nephrology | Admitting: Nephrology

## 2017-08-17 DIAGNOSIS — R319 Hematuria, unspecified: Secondary | ICD-10-CM

## 2017-09-15 ENCOUNTER — Other Ambulatory Visit: Payer: Self-pay | Admitting: Urology

## 2017-09-17 NOTE — Patient Instructions (Addendum)
Antonio Zuniga  09/17/2017   Your procedure is scheduled on: 09-23-17   Report to [facility] Main  Entrance    Report to Admitting at 6:45 AM    Call this number if you have problems the morning of surgery [phone]   Remember: Do not eat food or drink liquids :After Midnight.     Take these medicines the morning of surgery with A SIP OF WATER: None                                You may not have any metal on your body including hair pins and              piercings  Do not wear jewelry, lotions, powders or deodorant             Men may shave face and neck.   Do not bring valuables to the hospital. [place].  Contacts, dentures or bridgework may not be worn into surgery.     Patients discharged the day of surgery will not be allowed to drive home.  Name and phone number of your driver:[name] [phone]               Please read over the following fact sheets you were given: _____________________________________________________________________          [facility] - Preparing for Surgery Before surgery, you can play an important role.  Because skin is not sterile, your skin needs to be as free of germs as possible.  You can reduce the number of germs on your skin by washing with CHG (chlorahexidine gluconate) soap before surgery.  CHG is an antiseptic cleaner which kills germs and bonds with the skin to continue killing germs even after washing. Please DO NOT use if you have an allergy to CHG or antibacterial soaps.  If your skin becomes reddened/irritated stop using the CHG and inform your nurse when you arrive at Short Stay. Do not shave (including legs and underarms) for at least 48 hours prior to the first CHG shower.  You may shave your face/neck. Please follow these instructions carefully:  1.  Shower with CHG Soap the night before surgery and the  morning of Surgery.  2.  If you choose to wash  your hair, wash your hair first as usual with your  normal  shampoo.  3.  After you shampoo, rinse your hair and body thoroughly to remove the  shampoo.                           4.  Use CHG as you would any other liquid soap.  You can apply chg directly  to the skin and wash                       Gently with a scrungie or clean washcloth.  5.  Apply the CHG Soap to your body ONLY FROM THE NECK DOWN.   Do not use on face/ open                           Wound or open sores.  Avoid contact with eyes, ears mouth and genitals (private parts).                       Wash face,  Genitals (private parts) with your normal soap.             6.  Wash thoroughly, paying special attention to the area where your surgery  will be performed.  7.  Thoroughly rinse your body with warm water from the neck down.  8.  DO NOT shower/wash with your normal soap after using and rinsing off  the CHG Soap.                9.  Pat yourself dry with a clean towel.            10.  Wear clean pajamas.            11.  Place clean sheets on your bed the night of your first shower and do not  sleep with pets. Day of Surgery : Do not apply any lotions/deodorants the morning of surgery.  Please wear clean clothes to the hospital/surgery center.  FAILURE TO FOLLOW THESE INSTRUCTIONS MAY RESULT IN THE CANCELLATION OF YOUR SURGERY PATIENT SIGNATURE_________________________________  NURSE SIGNATURE__________________________________  ________________________________________________________________________

## 2017-09-18 ENCOUNTER — Other Ambulatory Visit: Payer: Self-pay

## 2017-09-18 ENCOUNTER — Encounter (HOSPITAL_COMMUNITY)
Admission: RE | Admit: 2017-09-18 | Discharge: 2017-09-18 | Disposition: A | Discharge status: Home / Self Care | Payer: BLUE CROSS/BLUE SHIELD | Source: Ambulatory Visit | Attending: Urology | Admitting: Urology

## 2017-09-18 ENCOUNTER — Encounter (HOSPITAL_COMMUNITY): Payer: Self-pay

## 2017-09-18 DIAGNOSIS — Z01812 Encounter for preprocedural laboratory examination: Secondary | ICD-10-CM | POA: Insufficient documentation

## 2017-09-18 DIAGNOSIS — C679 Malignant neoplasm of bladder, unspecified: Secondary | ICD-10-CM | POA: Insufficient documentation

## 2017-09-18 LAB — BASIC METABOLIC PANEL
ANION GAP: 10 (ref 5–15)
BUN: 15 mg/dL (ref 6–20)
CHLORIDE: 104 mmol/L (ref 101–111)
CO2: 24 mmol/L (ref 22–32)
Calcium: 9.1 mg/dL (ref 8.9–10.3)
Creatinine, Ser: 0.67 mg/dL (ref 0.61–1.24)
GFR calc non Af Amer: >60 mL/min (ref >60)
Glucose, Bld: 103 mg/dL — ABNORMAL HIGH (ref 65–99)
POTASSIUM: 4.3 mmol/L (ref 3.5–5.1)
SODIUM: 138 mmol/L (ref 135–145)

## 2017-09-18 LAB — CBC
HCT: 44.2 % (ref 39.0–52.0)
Hemoglobin: 15.1 g/dL (ref 13.0–17.0)
MCH: 31.1 pg (ref 26.0–34.0)
MCHC: 34.2 g/dL (ref 30.0–36.0)
MCV: 90.9 fL (ref 78.0–100.0)
PLATELETS: 177 10*3/uL (ref 150–400)
RBC: 4.86 MIL/uL (ref 4.22–5.81)
RDW: 12.6 % (ref 11.5–15.5)
WBC: 6.3 10*3/uL (ref 4.0–10.5)

## 2017-09-22 MED ORDER — DEXTROSE 5 % IV SOLN
5.0000 mg/kg | INTRAVENOUS | Status: AC
  Administered 2017-09-23: 400 mg via INTRAVENOUS
  Filled 2017-09-22: qty 10

## 2017-09-23 ENCOUNTER — Encounter (HOSPITAL_COMMUNITY): Payer: Self-pay

## 2017-09-23 ENCOUNTER — Ambulatory Visit (HOSPITAL_COMMUNITY): Payer: BLUE CROSS/BLUE SHIELD | Admitting: Anesthesiology

## 2017-09-23 ENCOUNTER — Ambulatory Visit (HOSPITAL_COMMUNITY)
Admission: RE | Admit: 2017-09-23 | Discharge: 2017-09-23 | Disposition: A | Discharge status: Home / Self Care | Payer: BLUE CROSS/BLUE SHIELD | Source: Ambulatory Visit | Attending: Urology | Admitting: Urology

## 2017-09-23 ENCOUNTER — Ambulatory Visit (HOSPITAL_COMMUNITY): Payer: BLUE CROSS/BLUE SHIELD

## 2017-09-23 ENCOUNTER — Encounter (HOSPITAL_COMMUNITY)
Admission: RE | Disposition: A | Discharge status: Home / Self Care | Payer: Self-pay | Source: Ambulatory Visit | Attending: Urology

## 2017-09-23 DIAGNOSIS — I10 Essential (primary) hypertension: Secondary | ICD-10-CM | POA: Insufficient documentation

## 2017-09-23 DIAGNOSIS — C672 Malignant neoplasm of lateral wall of bladder: Secondary | ICD-10-CM | POA: Diagnosis present

## 2017-09-23 DIAGNOSIS — R3129 Other microscopic hematuria: Secondary | ICD-10-CM | POA: Diagnosis not present

## 2017-09-23 DIAGNOSIS — Z79899 Other long term (current) drug therapy: Secondary | ICD-10-CM | POA: Insufficient documentation

## 2017-09-23 HISTORY — PX: TRANSURETHRAL RESECTION OF BLADDER TUMOR: SHX2575

## 2017-09-23 HISTORY — PX: CYSTOSCOPY W/ URETERAL STENT PLACEMENT: SHX1429

## 2017-09-23 SURGERY — TURBT (TRANSURETHRAL RESECTION OF BLADDER TUMOR)
Anesthesia: General

## 2017-09-23 MED ORDER — DEXAMETHASONE SODIUM PHOSPHATE 10 MG/ML IJ SOLN
INTRAMUSCULAR | Status: DC | PRN
  Administered 2017-09-23: 10 mg via INTRAVENOUS

## 2017-09-23 MED ORDER — OXYCODONE HCL 5 MG/5ML PO SOLN
5.0000 mg | Freq: Once | ORAL | Status: DC | PRN
  Filled 2017-09-23: qty 5

## 2017-09-23 MED ORDER — DEXAMETHASONE SODIUM PHOSPHATE 10 MG/ML IJ SOLN
INTRAMUSCULAR | Status: AC
  Filled 2017-09-23: qty 1

## 2017-09-23 MED ORDER — ONDANSETRON HCL 4 MG/2ML IJ SOLN
INTRAMUSCULAR | Status: AC
  Filled 2017-09-23: qty 2

## 2017-09-23 MED ORDER — SODIUM CHLORIDE 0.9 % IR SOLN
Status: DC | PRN
  Administered 2017-09-23: 6000 mL

## 2017-09-23 MED ORDER — OXYCODONE HCL 5 MG PO TABS: 5.0000 mg | ORAL_TABLET | Freq: Once | ORAL | Status: DC | PRN

## 2017-09-23 MED ORDER — LACTATED RINGERS IV SOLN
INTRAVENOUS | Status: DC
  Administered 2017-09-23: 1000 mL via INTRAVENOUS

## 2017-09-23 MED ORDER — FENTANYL CITRATE (PF) 100 MCG/2ML IJ SOLN
INTRAMUSCULAR | Status: DC | PRN
  Administered 2017-09-23 (×2): 50 ug via INTRAVENOUS

## 2017-09-23 MED ORDER — LIP MEDEX EX OINT
TOPICAL_OINTMENT | CUTANEOUS | Status: AC
  Filled 2017-09-23: qty 7

## 2017-09-23 MED ORDER — FENTANYL CITRATE (PF) 100 MCG/2ML IJ SOLN
INTRAMUSCULAR | Status: AC
  Filled 2017-09-23: qty 2

## 2017-09-23 MED ORDER — PROPOFOL 10 MG/ML IV BOLUS
INTRAVENOUS | Status: DC | PRN
  Administered 2017-09-23: 200 mg via INTRAVENOUS

## 2017-09-23 MED ORDER — SENNOSIDES-DOCUSATE SODIUM 8.6-50 MG PO TABS: 1.0000 | ORAL_TABLET | Freq: Two times a day (BID) | ORAL | 0 refills | Status: AC

## 2017-09-23 MED ORDER — PROMETHAZINE HCL 25 MG/ML IJ SOLN: 6.2500 mg | INTRAMUSCULAR | Status: DC | PRN

## 2017-09-23 MED ORDER — LIDOCAINE 2% (20 MG/ML) 5 ML SYRINGE
INTRAMUSCULAR | Status: DC | PRN
  Administered 2017-09-23: 100 mg via INTRAVENOUS

## 2017-09-23 MED ORDER — TRAMADOL HCL 50 MG PO TABS: 50.0000 mg | ORAL_TABLET | Freq: Four times a day (QID) | ORAL | 0 refills | Status: AC | PRN

## 2017-09-23 MED ORDER — LIDOCAINE 2% (20 MG/ML) 5 ML SYRINGE
INTRAMUSCULAR | Status: AC
  Filled 2017-09-23: qty 5

## 2017-09-23 MED ORDER — 0.9 % SODIUM CHLORIDE (POUR BTL) OPTIME
TOPICAL | Status: DC | PRN
  Administered 2017-09-23: 1000 mL

## 2017-09-23 MED ORDER — HYDROMORPHONE HCL 1 MG/ML IJ SOLN: 0.2500 mg | INTRAMUSCULAR | Status: DC | PRN

## 2017-09-23 MED ORDER — PROPOFOL 10 MG/ML IV BOLUS
INTRAVENOUS | Status: AC
  Filled 2017-09-23: qty 20

## 2017-09-23 MED ORDER — IOHEXOL 300 MG/ML  SOLN
INTRAMUSCULAR | Status: DC | PRN
  Administered 2017-09-23: 10 mL

## 2017-09-23 MED ORDER — ONDANSETRON HCL 4 MG/2ML IJ SOLN
INTRAMUSCULAR | Status: DC | PRN
  Administered 2017-09-23: 4 mg via INTRAVENOUS

## 2017-09-23 SURGICAL SUPPLY — 21 items
BAG URINE DRAINAGE (UROLOGICAL SUPPLIES) IMPLANT
BAG URO CATCHER STRL LF (MISCELLANEOUS) ×4 IMPLANT
BASKET ZERO TIP NITINOL 2.4FR (BASKET) IMPLANT
CATH INTERMIT  6FR 70CM (CATHETERS) IMPLANT
CLOTH BEACON ORANGE TIMEOUT ST (SAFETY) ×4 IMPLANT
COVER FOOTSWITCH UNIV (MISCELLANEOUS) IMPLANT
COVER SURGICAL LIGHT HANDLE (MISCELLANEOUS) IMPLANT
ELECT REM PT RETURN 15FT ADLT (MISCELLANEOUS) IMPLANT
EVACUATOR MICROVAS BLADDER (UROLOGICAL SUPPLIES) IMPLANT
GLOVE BIOGEL M STRL SZ7.5 (GLOVE) ×4 IMPLANT
GOWN STRL REUS W/TWL LRG LVL3 (GOWN DISPOSABLE) ×8 IMPLANT
GUIDEWIRE ANG ZIPWIRE 038X150 (WIRE) IMPLANT
GUIDEWIRE STR DUAL SENSOR (WIRE) ×4 IMPLANT
LOOP CUT BIPOLAR 24F LRG (ELECTROSURGICAL) ×4 IMPLANT
MANIFOLD NEPTUNE II (INSTRUMENTS) ×4 IMPLANT
PACK CYSTO (CUSTOM PROCEDURE TRAY) ×4 IMPLANT
SET ASPIRATION TUBING (TUBING) IMPLANT
SYRINGE IRR TOOMEY STRL 70CC (SYRINGE) IMPLANT
TUBING CONNECTING 10 (TUBING) ×3 IMPLANT
TUBING CONNECTING 10' (TUBING) ×1
TUBING UROLOGY SET (TUBING) ×4 IMPLANT

## 2017-09-23 NOTE — H&P (Signed)
Antonio Zuniga is an 52 y.o. male.    Chief Complaint: PRe-Op Transurethral Resection of BLadder TUmor  HPI:   1 - Microscopic Hematuria - blood on UA noted x several at nephrology appt. Few gross episodes. Non smoker. No chronic solvent exposure.   2 - Bladder Cancer - ? bladder mass 2cm by [place] 07/2017, appears vascular and close to Rt UO, no hdyro. Non smoker. Office Cysto 08/2017 confrims about 3cm papillary bladder cancer.   PMH sig for nephrotic syndrome (rare flares, follws [facility]), HTN. NO prior surgeries. NO ischemic CV disease. 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 His daughter Antonio Zuniga (pronounced Nok) is very involved in his care and helps with complex translation.   Today "Antonio Zuniga" (pronounced Koo) is seen to proceed with TURBT for small bladder tumor   Past Medical History:  Diagnosis Date  . Allergy     History reviewed. No pertinent surgical history.  History reviewed. No pertinent family history. Social History:  reports that he has never smoked. He has never used smokeless tobacco. He reports that he drinks alcohol. He reports that he does not use drugs.  Allergies: No Known Allergies  Medications Prior to Admission  Medication Sig Dispense Refill  . atorvastatin (LIPITOR) 20 MG tablet Take 20 mg by mouth daily.  12  . Bepotastine Besilate (BEPREVE) 1.5 % SOLN Can use one drop in each eye twice daily as needed for itchy eyes. (Patient not taking: Reported on 09/15/2017) 10 mL 5  . ibuprofen (ADVIL,MOTRIN) 800 MG tablet Take 1 tablet (800 mg total) by mouth every 8 (eight) hours as needed for mild pain or moderate pain. (Patient not taking: Reported on 09/15/2017) 15 tablet 0  . methocarbamol (ROBAXIN) 500 MG tablet Take 1-2 tablets (500-1,000 mg total) by mouth every 6 (six) hours as needed. (Patient not taking: Reported on 09/15/2017) 15 tablet 0  . montelukast (SINGULAIR) 10 MG tablet take 1 tablet by  mouth once daily (Patient not taking: Reported on 09/18/2017) 30 tablet 0    No results found for this or any previous visit (from the past 48 hour(s)). No results found.  Review of Systems  Constitutional: Negative.  Negative for chills and fever.  HENT: Negative.   Eyes: Negative.   Respiratory: Negative.   Cardiovascular: Negative.   Gastrointestinal: Negative.   Genitourinary: Negative.   Musculoskeletal: Negative.   Skin: Negative.   Neurological: Negative.   Endo/Heme/Allergies: Negative.   Psychiatric/Behavioral: Negative.     Blood pressure (!) 158/92, pulse 66, temperature 97.9 F (36.6 C), temperature source Oral, resp. rate 16, height 5\' 9"  (1.753 m), weight 80.3 kg (177 lb), SpO2 100 %. Physical Exam  Constitutional: He appears well-developed.  Daughter at bedside.   HENT:  Head: Normocephalic.  Eyes: Pupils are equal, round, and reactive to light.  Neck: Normal range of motion.  Cardiovascular: Normal rate.  Respiratory: Effort normal.  GI: Soft.  Genitourinary: Penis normal.  Musculoskeletal: Normal range of motion.  Neurological: He is alert.  Skin: Skin is warm.  Psychiatric: He has a normal mood and affect.     Assessment/Plan  Proceed as planned with cysto, retrogrades, TURBT. Risks, benefits, alternatives, expected peri-op course discussed previouslyi and reiterated today.   Alexis Frock, MD 09/23/2017, 8:38 AM

## 2017-09-23 NOTE — Anesthesia Preprocedure Evaluation (Signed)
Anesthesia Evaluation  Patient identified by MRN, date of birth, ID band Patient awake    Reviewed: Allergy & Precautions, NPO status , Patient's Chart, lab work & pertinent test results  Airway Mallampati: II  TM Distance: >3 FB Neck ROM: Full    Dental no notable dental hx.    Pulmonary neg pulmonary ROS,    Pulmonary exam normal breath sounds clear to auscultation       Cardiovascular negative cardio ROS Normal cardiovascular exam Rhythm:Regular Rate:Normal     Neuro/Psych negative neurological ROS  negative psych ROS   GI/Hepatic negative GI ROS, Neg liver ROS,   Endo/Other  negative endocrine ROS  Renal/GU negative Renal ROS  negative genitourinary   Musculoskeletal negative musculoskeletal ROS (+)   Abdominal   Peds negative pediatric ROS (+)  Hematology negative hematology ROS (+)   Anesthesia Other Findings Bladder Cancer  Reproductive/Obstetrics negative OB ROS                             Anesthesia Physical Anesthesia Plan  ASA: II  Anesthesia Plan: General   Post-op Pain Management:    Induction: Intravenous  PONV Risk Score and Plan: 2 and Ondansetron and Midazolam  Airway Management Planned: LMA  Additional Equipment:   Intra-op Plan:   Post-operative Plan: Extubation in OR  Informed Consent: I have reviewed the patients History and Physical, chart, labs and discussed the procedure including the risks, benefits and alternatives for the proposed anesthesia with the patient or authorized representative who has indicated his/her understanding and acceptance.   Dental advisory given  Plan Discussed with: CRNA  Anesthesia Plan Comments:         Anesthesia Quick Evaluation

## 2017-09-23 NOTE — Brief Op Note (Signed)
09/23/2017  10:18 AM  PATIENT:  Antonio Zuniga  52 y.o. male  PRE-OPERATIVE DIAGNOSIS:  BLADDER CANCER  POST-OPERATIVE DIAGNOSIS:  BLADDER CANCER  PROCEDURE:  Procedure(s): TRANSURETHRAL RESECTION OF BLADDER TUMOR (TURBT) (N/A) BILATERAL RETROGRADE PYELOGRAMS (Bilateral)  SURGEON:  Surgeon(s) and Role:    * Alexis Frock, MD - Primary  PHYSICIAN ASSISTANT:   ASSISTANTS: none   ANESTHESIA:   general  EBL:  0 mL   BLOOD ADMINISTERED:none  DRAINS: none   LOCAL MEDICATIONS USED:  NONE  SPECIMEN:  Source of Specimen:  1 - bladder tumor, 2 - base of bladder tumor  DISPOSITION OF SPECIMEN:  PATHOLOGY  COUNTS:  YES  TOURNIQUET:  * No tourniquets in log *  DICTATION: .Other Dictation: Dictation Number [id number]  PLAN OF CARE: Discharge to home after PACU  PATIENT DISPOSITION:  PACU - hemodynamically stable.   Delay start of Pharmacological VTE agent (>24hrs) due to surgical blood loss or risk of bleeding: not applicable

## 2017-09-23 NOTE — Transfer of Care (Signed)
Immediate Anesthesia Transfer of Care Note  Patient: Antonio Zuniga  Procedure(s) Performed: TRANSURETHRAL RESECTION OF BLADDER TUMOR (TURBT) (N/A ) BILATERAL RETROGRADE PYELOGRAMS (Bilateral )  Patient Location: PACU  Anesthesia Type:General  Level of Consciousness: awake, alert  and oriented  Airway & Oxygen Therapy: Patient Spontanous Breathing and Patient connected to face mask oxygen  Post-op Assessment: Report given to RN and Post -op Vital signs reviewed and stable  Post vital signs: Reviewed and stable  Last Vitals:  Vitals Value Taken Time  BP    Temp    Pulse    Resp    SpO2      Last Pain:  Vitals:   09/23/17 0755  TempSrc: Oral      Patients Stated Pain Goal: 4 (84/73/08 5694)  Complications: No apparent anesthesia complications

## 2017-09-23 NOTE — Anesthesia Procedure Notes (Signed)
Procedure Name: LMA Insertion Date/Time: 09/23/2017 9:49 AM Performed by: Sharlette Dense, CRNA Patient Re-evaluated:Patient Re-evaluated prior to induction Oxygen Delivery Method: Circle system utilized Preoxygenation: Pre-oxygenation with 100% oxygen Induction Type: IV induction Ventilation: Mask ventilation without difficulty LMA: LMA inserted LMA Size: 4.0 Number of attempts: 1 Placement Confirmation: positive ETCO2 and breath sounds checked- equal and bilateral Dental Injury: Teeth and Oropharynx as per pre-operative assessment

## 2017-09-23 NOTE — Op Note (Signed)
Antonio Zuniga, Antonio Zuniga MEDICAL RECORD [id number] ACCOUNT [id number] DATE OF BIRTH:[birth year] FACILITY: [facility] LOCATION: [facility] PHYSICIAN:Wilburta Milbourn, MD  OPERATIVE REPORT  DATE OF PROCEDURE:  09/23/2017  PREOPERATIVE DIAGNOSES:  Bladder cancer, history of hematuria.  PROCEDURE:   1.  Cystoscopy, bilateral retrograde pyelograms and interpretation.   2.  Transection of bladder tumor, volume medium.    ESTIMATED BLOOD LOSS:  Nil.  COMPLICATIONS:  None.  SPECIMENS: 1.  Bladder tumor. 2.  Base of bladder tumor for pathology.  FINDINGS: 1.  Approximately 3 cm papillary bladder tumor, quite vascular just lateral to the right ureteral orifice, not involving the right ureteral orifice. 2.  Unremarkable retrograde pyelograms. 3.  Noninjury of right ureteral orifice following transection of bladder tumor.  It was felt that stenting would not be warranted.  INDICATIONS:  The patient is a very pleasant 52 year old gentleman who has a history of microhematuria and abnormal pelvic ultrasound to have a papillary bladder tumor lateral to the right ureteral orifice by office cystoscopy.  Options were discussed  for management including recommended path of operative transurethral resection for a goal of staging tissue diagnosis and initial treatment.  He wished to proceed.  Informed consent was obtained and placed in medical record.  DESCRIPTION OF PROCEDURE:  The patient identified as being himself.  Procedure was verified being transection and biopsy and retrogrades were confirmed.  Procedure timeout was performed and intravenous antibiotics were administered.  General anesthesia  induced.  The patient was placed in a low lithotomy position.  A sterile field was created and prepped and draped at the base of his penis and perineum, proximal thighs using IODINE.  Cystourethroscopy was performed using a 22-French rigid cystoscope  offset lens.  First, the anterior and posterior urethra were  unremarkable.  Inspection of bladder revealed an approximately 3 cm papillary tumor with some dystrophic calcifications lateral but not involving the right ureteral orifice.  Attention was  turned to the retrograde pyelograms.  The left ureteral orifice was cannulated with a femoral catheter and left retrograde pyelogram was seen.  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  No filling defects or  narrowing noted.  Similarly, right retrograde pyelogram was obtained.  Right retrograde pyelogram revealed a single right ureter, single system right kidney.  No filling defect or narrowing noted.  Next, the scope was changed to a 26-French Storz  resectoscope sheath with visual obturator and using medium size resectoscope loop was used and very careful resection was performed of the bladder tumor down to what appeared to be very superficial fibromuscular stroma of the urinary bladder.  Exquisite  care was taken to avoid any direct injury to the orifice, which did not occur grossly.  The tumor fragments were irrigated, set aside for pathology labeled bladder tumor.  Next, a cold cup biopsy forceps were used to obtain representative muscular bites  deep to the tumor, satisfied, labeled as base of bladder tumor.  Additional fulguration current was applied to the base of this, resulted in excellent hemostasis and there was complete removal  of all visible tumor.  The right ureteral orifice remained visibly  patent.  It was not felt that it was warranted.  The bladder was partially emptied per cystoscope.  Procedure terminated.    The patient tolerated the procedure well.  No immediate complications.  The patient was taken to [place] [facility] in stable condition.  AN/NUANCE  D:09/23/2017 T:09/23/2017 JOB:[id number]

## 2017-09-23 NOTE — Discharge Instructions (Signed)
1 - You may have urinary urgency (bladder spasms) and bloody urine on / off for up to two weeks. This is normal.  2 - Call MD or go to ER for fever >102, severe pain / nausea / vomiting not relieved by medications, or acute change in medical status

## 2017-09-24 ENCOUNTER — Encounter (HOSPITAL_COMMUNITY): Payer: Self-pay | Admitting: Urology

## 2017-09-24 NOTE — Anesthesia Postprocedure Evaluation (Signed)
Anesthesia Post Note  Patient: Antonio Zuniga  Procedure(s) Performed: TRANSURETHRAL RESECTION OF BLADDER TUMOR (TURBT) (N/A ) BILATERAL RETROGRADE PYELOGRAMS (Bilateral )     Patient location during evaluation: PACU Anesthesia Type: General Level of consciousness: sedated and patient cooperative Pain management: pain level controlled Vital Signs Assessment: post-procedure vital signs reviewed and stable Respiratory status: spontaneous breathing Cardiovascular status: stable Anesthetic complications: no    Last Vitals:  Vitals:   09/23/17 1115 09/23/17 1200  BP: (!) 135/91 (!) 140/95  Pulse: 71 70  Resp: 13 16  Temp: 36.7 C 36.7 C  SpO2: 99% 100%    Last Pain:  Vitals:   09/23/17 1145  TempSrc:   PainSc: 0-No pain                 Nolon Nations

## 2018-09-16 IMAGING — US US RENAL
1 series · 14 of 25 positions shown · non-contrast
Comparison: Body CT 02/28/2004

CLINICAL DATA: Hematuria.

EXAM:
RENAL / URINARY TRACT ULTRASOUND COMPLETE

[Series 1: us renal · 0.23mm/px · 14 of 50 slices shown]
[im 1/50]
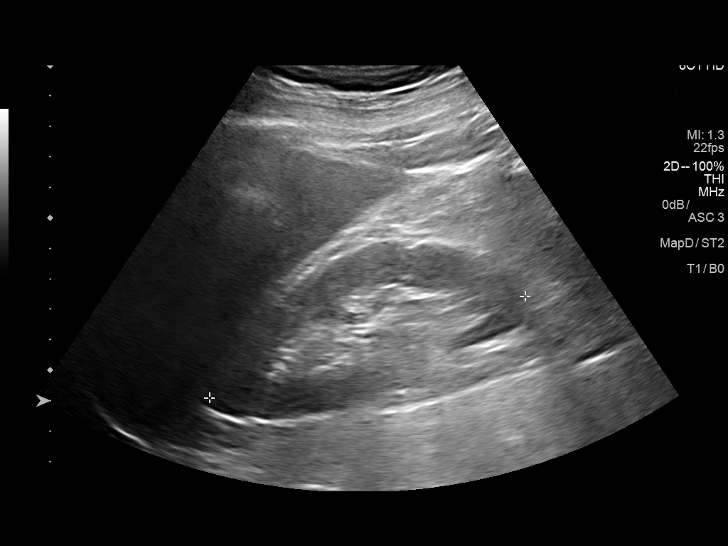
[im 5/50]
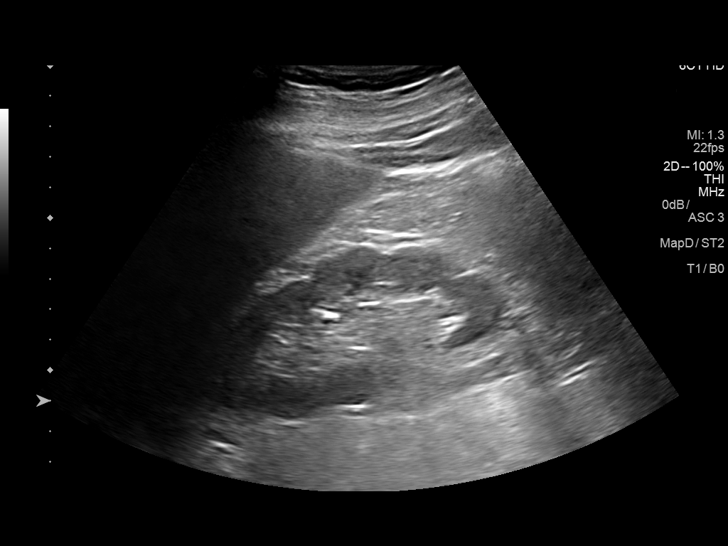
[im 9/50]
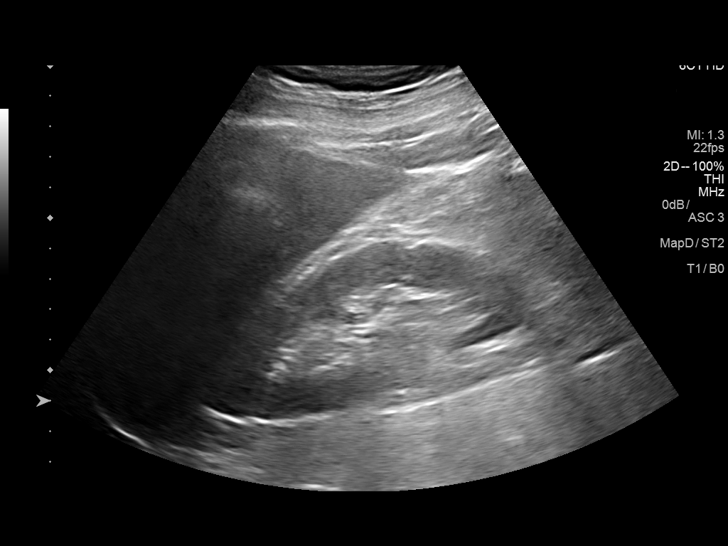
[im 13/50]
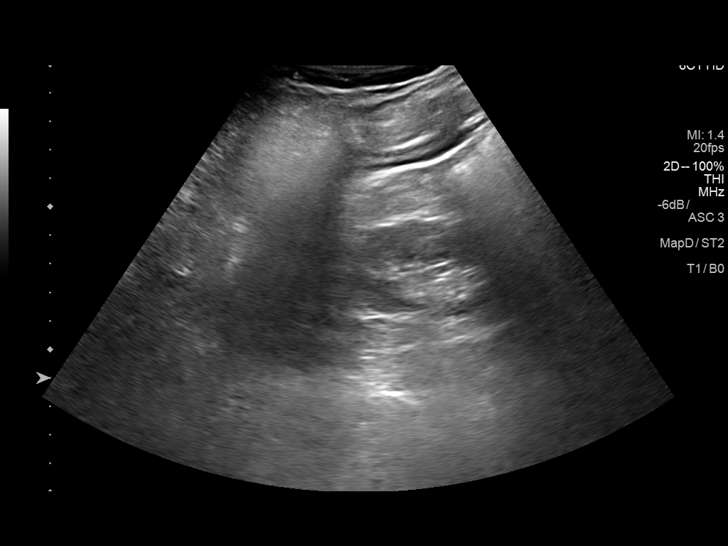
[im 17/50]
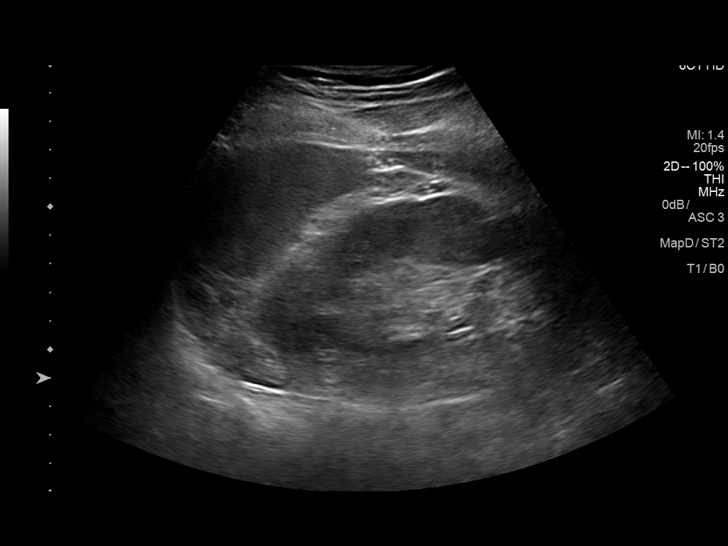
[im 19/50]
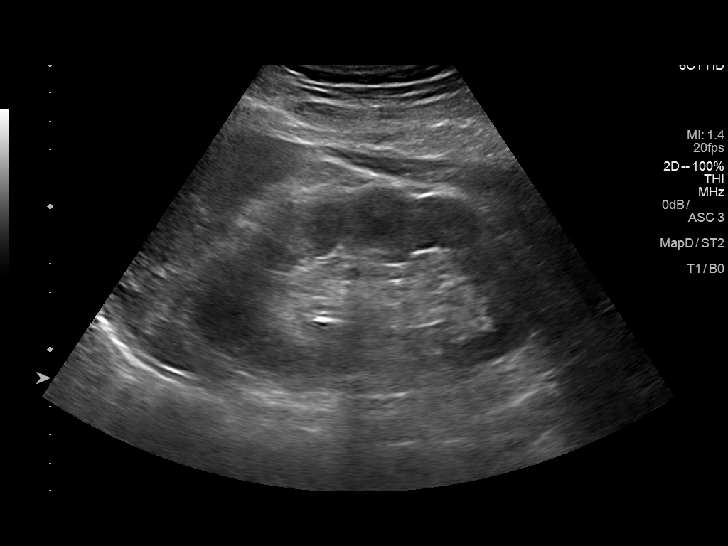
[im 23/50]
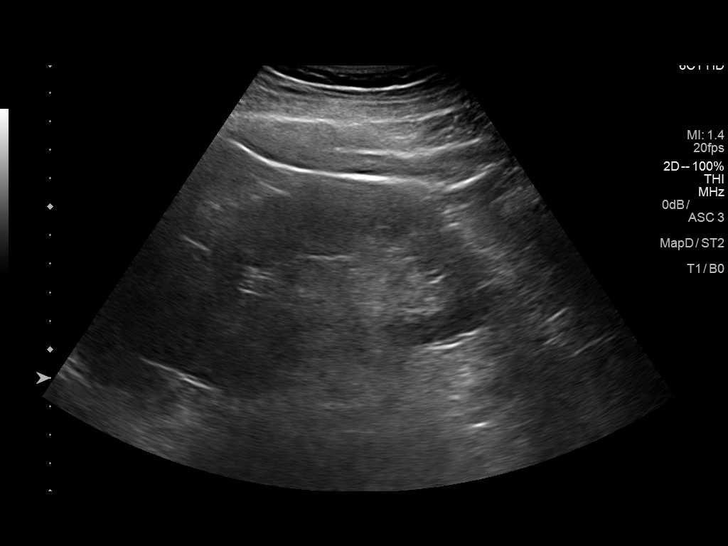
[im 27/50]
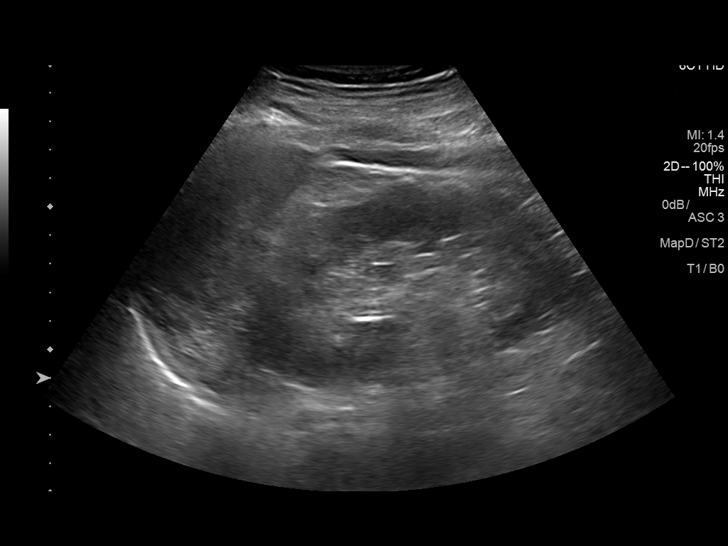
[im 31/50]
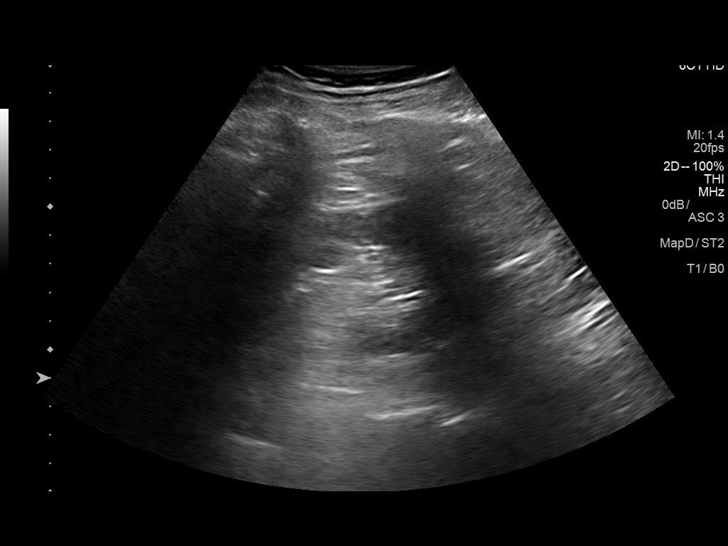
[im 33/50]
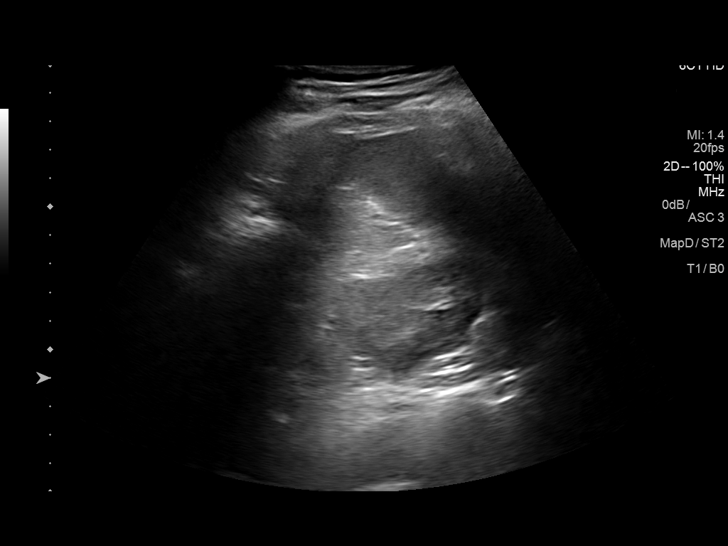
[im 37/50]
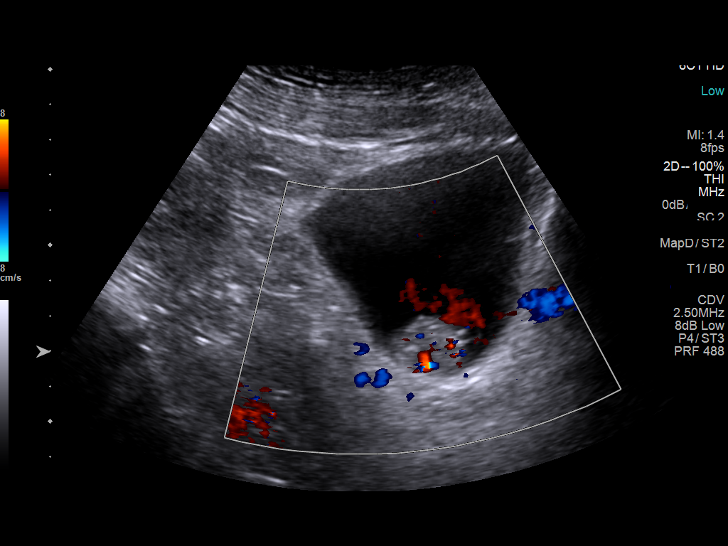
[im 41/50]
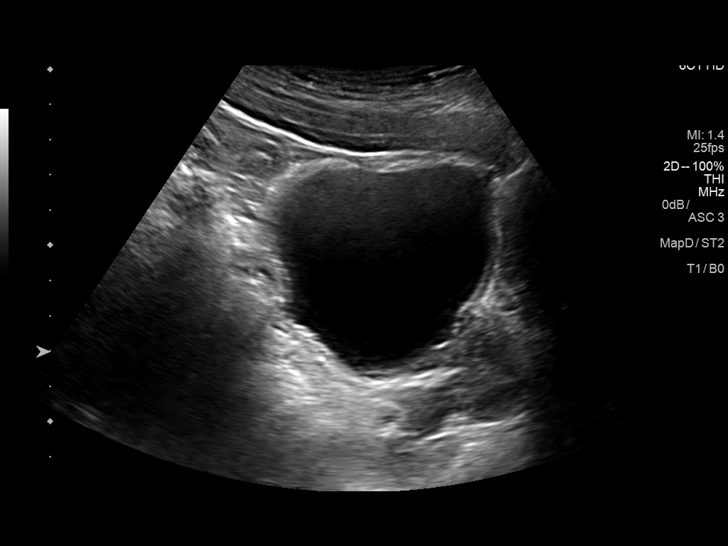
[im 45/50]
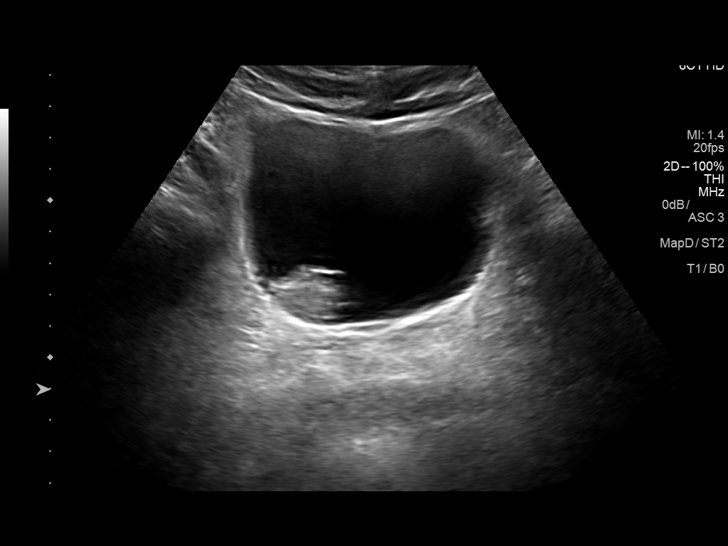
[im 50/50]
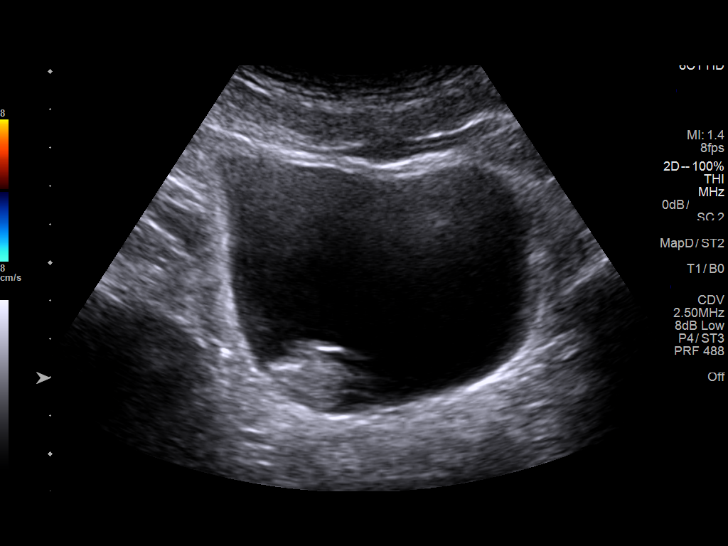

[14 of 25 positions shown; findings below may reference images not displayed]

FINDINGS: Right Kidney:

Length: 11.2 cm. Echogenicity within normal limits. No mass or
hydronephrosis visualized.

Left Kidney:

Length: 12.7 cm. Echogenicity within normal limits. No mass or
hydronephrosis visualized.

Bladder:

There is a solid mass within the right posterior urinary bladder
which measures 2.1 x 1.6 x 2.3 cm. Bilateral ureteral jets are seen.
IMPRESSION: 2.3 cm solid mass arising off of the posterolateral right urinary
bladder wall, suspicious for malignancy.

Further evaluation with cystoscopy may be considered.

Normal appearance of the kidneys.

These results will be called to the ordering clinician or
representative by the Radiologist Assistant, and communication
documented in the PACS or zVision Dashboard.
